# Patient Record
Sex: Male | Born: 1970 | Race: Black or African American | Hispanic: No | Marital: Married | Smoking: Never smoker
Health system: Southern US, Community
[De-identification: ages and names within clinical notes are randomized; demographics above are authoritative.]

## PROBLEM LIST (undated history)

## (undated) DIAGNOSIS — R51 Headache: Secondary | ICD-10-CM

## (undated) DIAGNOSIS — Z9889 Other specified postprocedural states: Secondary | ICD-10-CM

## (undated) DIAGNOSIS — F419 Anxiety disorder, unspecified: Secondary | ICD-10-CM

## (undated) DIAGNOSIS — R519 Headache, unspecified: Secondary | ICD-10-CM

## (undated) DIAGNOSIS — R112 Nausea with vomiting, unspecified: Secondary | ICD-10-CM

## (undated) DIAGNOSIS — T783XXA Angioneurotic edema, initial encounter: Secondary | ICD-10-CM

## (undated) DIAGNOSIS — I82409 Acute embolism and thrombosis of unspecified deep veins of unspecified lower extremity: Secondary | ICD-10-CM

## (undated) DIAGNOSIS — R7303 Prediabetes: Secondary | ICD-10-CM

## (undated) DIAGNOSIS — I10 Essential (primary) hypertension: Secondary | ICD-10-CM

## (undated) DIAGNOSIS — M8700 Idiopathic aseptic necrosis of unspecified bone: Secondary | ICD-10-CM

## (undated) HISTORY — DX: Headache: R51

## (undated) HISTORY — PX: OTHER SURGICAL HISTORY: SHX169

## (undated) HISTORY — DX: Angioneurotic edema, initial encounter: T78.3XXA

## (undated) HISTORY — DX: Headache, unspecified: R51.9

---

## 2005-04-11 ENCOUNTER — Emergency Department (HOSPITAL_COMMUNITY): Admission: EM | Admit: 2005-04-11 | Discharge: 2005-04-12 | Payer: Self-pay | Admitting: Emergency Medicine

## 2010-06-18 ENCOUNTER — Emergency Department (HOSPITAL_COMMUNITY)
Admission: EM | Admit: 2010-06-18 | Discharge: 2010-06-18 | Disposition: A | Discharge status: Home / Self Care | Payer: Self-pay | Attending: Emergency Medicine | Admitting: Emergency Medicine

## 2010-06-18 ENCOUNTER — Emergency Department (HOSPITAL_COMMUNITY): Payer: Self-pay

## 2010-06-18 DIAGNOSIS — S6990XA Unspecified injury of unspecified wrist, hand and finger(s), initial encounter: Secondary | ICD-10-CM | POA: Insufficient documentation

## 2010-06-18 DIAGNOSIS — W2209XA Striking against other stationary object, initial encounter: Secondary | ICD-10-CM | POA: Insufficient documentation

## 2010-06-18 DIAGNOSIS — Y9349 Activity, other involving dancing and other rhythmic movements: Secondary | ICD-10-CM | POA: Insufficient documentation

## 2010-06-18 DIAGNOSIS — M79609 Pain in unspecified limb: Secondary | ICD-10-CM | POA: Insufficient documentation

## 2010-06-18 DIAGNOSIS — M20029 Boutonniere deformity of unspecified finger(s): Secondary | ICD-10-CM | POA: Insufficient documentation

## 2013-04-11 ENCOUNTER — Encounter (HOSPITAL_COMMUNITY): Payer: Self-pay | Admitting: Emergency Medicine

## 2013-04-11 DIAGNOSIS — M546 Pain in thoracic spine: Secondary | ICD-10-CM | POA: Insufficient documentation

## 2013-04-11 DIAGNOSIS — I10 Essential (primary) hypertension: Secondary | ICD-10-CM | POA: Insufficient documentation

## 2013-04-11 DIAGNOSIS — Z8739 Personal history of other diseases of the musculoskeletal system and connective tissue: Secondary | ICD-10-CM | POA: Insufficient documentation

## 2013-04-11 NOTE — ED Notes (Signed)
Pt states pain in his upper right back. Pt denies known reason for the pain but over the past few days pt states pain has gotten worse. Pt denies problems with urine or bowel.

## 2013-04-12 ENCOUNTER — Emergency Department (HOSPITAL_COMMUNITY)
Admission: EM | Admit: 2013-04-12 | Discharge: 2013-04-12 | Disposition: A | Discharge status: Home / Self Care | Payer: PRIVATE HEALTH INSURANCE | Attending: Emergency Medicine | Admitting: Emergency Medicine

## 2013-04-12 ENCOUNTER — Emergency Department (HOSPITAL_COMMUNITY): Payer: PRIVATE HEALTH INSURANCE

## 2013-04-12 DIAGNOSIS — M549 Dorsalgia, unspecified: Secondary | ICD-10-CM

## 2013-04-12 HISTORY — DX: Essential (primary) hypertension: I10

## 2013-04-12 MED ORDER — KETOROLAC TROMETHAMINE 30 MG/ML IJ SOLN
INTRAMUSCULAR | Status: AC
  Administered 2013-04-12: 60 mg via INTRAMUSCULAR
  Filled 2013-04-12: qty 2

## 2013-04-12 MED ORDER — METHOCARBAMOL 500 MG PO TABS: 500.0000 mg | ORAL_TABLET | Freq: Two times a day (BID) | ORAL | Status: DC

## 2013-04-12 MED ORDER — KETOROLAC TROMETHAMINE 30 MG/ML IJ SOLN
60.0000 mg | Freq: Once | INTRAMUSCULAR | Status: AC
  Administered 2013-04-12: 60 mg via INTRAMUSCULAR

## 2013-04-12 MED ORDER — MELOXICAM 7.5 MG PO TABS: 15.0000 mg | ORAL_TABLET | Freq: Every day | ORAL | Status: DC

## 2013-04-12 MED ORDER — KETOROLAC TROMETHAMINE 60 MG/2ML IM SOLN: 60.0000 mg | Freq: Once | INTRAMUSCULAR | Status: DC

## 2013-04-12 NOTE — Discharge Instructions (Signed)
Your Xray today is negative for any acute lung process or rib injury. Recommend Mobic and Robaxin as prescribed as well as ice to the affected area 4 times a day for the next 3 days for at least 30 minutes each time. You may alternate ice and heat packs after 72 hours. Follow up with your orthopedist as needed. Return if symptoms worsen.  Back Pain, Adult Low back pain is very common. About 1 in 5 people have back pain.The cause of low back pain is rarely dangerous. The pain often gets better over time.About half of people with a sudden onset of back pain feel better in just 2 weeks. About 8 in 10 people feel better by 6 weeks.  CAUSES Some common causes of back pain include:  Strain of the muscles or ligaments supporting the spine.  Wear and tear (degeneration) of the spinal discs.  Arthritis.  Direct injury to the back. DIAGNOSIS Most of the time, the direct cause of low back pain is not known.However, back pain can be treated effectively even when the exact cause of the pain is unknown.Answering your caregiver's questions about your overall health and symptoms is one of the most accurate ways to make sure the cause of your pain is not dangerous. If your caregiver needs more information, he or she may order lab work or imaging tests (X-rays or MRIs).However, even if imaging tests show changes in your back, this usually does not require surgery. HOME CARE INSTRUCTIONS For many people, back pain returns.Since low back pain is rarely dangerous, it is often a condition that people can learn to [facility] their own.   Remain active. It is stressful on the back to sit or stand in one place. Do not sit, drive, or stand in one place for more than 30 minutes at a time. Take short walks on level surfaces as soon as pain allows.Try to increase the length of time you walk each day.  Do not stay in bed.Resting more than 1 or 2 days can delay your recovery.  Do not avoid exercise or work.Your body  is made to move.It is not dangerous to be active, even though your back may hurt.Your back will likely heal faster if you return to being active before your pain is gone.  Pay attention to your body when you bend and lift. Many people have less discomfortwhen lifting if they bend their knees, keep the load close to their bodies,and avoid twisting. Often, the most comfortable positions are those that put less stress on your recovering back.  Find a comfortable position to sleep. Use a firm mattress and lie on your side with your knees slightly bent. If you lie on your back, put a pillow under your knees.  Only take over-the-counter or prescription medicines as directed by your caregiver. Over-the-counter medicines to reduce pain and inflammation are often the most helpful.Your caregiver may prescribe muscle relaxant drugs.These medicines help dull your pain so you can more quickly return to your normal activities and healthy exercise.  Put ice on the injured area.  Put ice in a plastic bag.  Place a towel between your skin and the bag.  Leave the ice on for 15-20 minutes, 03-04 times a day for the first 2 to 3 days. After that, ice and heat may be alternated to reduce pain and spasms.  Ask your caregiver about trying back exercises and gentle massage. This may be of some benefit.  Avoid feeling anxious or stressed.Stress increases muscle tension  and can worsen back pain.It is important to recognize when you are anxious or stressed and learn ways to manage it.Exercise is a great option. SEEK MEDICAL CARE IF:  You have pain that is not relieved with rest or medicine.  You have pain that does not improve in 1 week.  You have new symptoms.  You are generally not feeling well. SEEK IMMEDIATE MEDICAL CARE IF:   You have pain that radiates from your back into your legs.  You develop new bowel or bladder control problems.  You have unusual weakness or numbness in your arms or  legs.  You develop nausea or vomiting.  You develop abdominal pain.  You feel faint. Document Released: 03/23/2005 Document Revised: 09/22/2011 Document Reviewed: 08/11/2010 [facility] Patient Information 2014 [place], [place].

## 2013-04-12 NOTE — ED Notes (Signed)
Patient presents stating he has started with pain to his mid back.  Denies picking up anything heavy.

## 2013-04-12 NOTE — ED Provider Notes (Signed)
Medical screening examination/treatment/procedure(s) were performed by non-physician practitioner and as supervising physician I was immediately available for consultation/collaboration.  EKG Interpretation   None         Glynn OctaveStephen Jacque Byron, MD 04/12/13 [phone]

## 2013-04-12 NOTE — ED Notes (Signed)
Discharge inst given

## 2013-04-12 NOTE — ED Provider Notes (Signed)
CSN: [id number]     Arrival date & time 04/11/13  2305 History   First MD Initiated Contact with Patient 04/12/13 0043     Chief Complaint  Patient presents with  . Back Pain   (Consider location/radiation/quality/duration/timing/severity/associated sxs/prior Treatment) HPI Comments: Patient is a 43 y/o male with a hx of arthritis who presents today for 1 week of L thoracic back pain that has been worsening x 3 days. Pain is constant, throbbing, and waxing and waning in nature with intermittent sharp pain sensations. Pain is nonradiating and without modifying factors. He has not taken anything OTC for his symptoms. He denies trauma/injury to the area or heavy lifting. Patient further denies associated fever, SOB, DOE, cough, CP, numbness/tingling, extremity weakness, bowel/bladder incontinence, saddle anesthesia, perianal numbness, prolonged travel, HRT, and recent surgeries/hospitalizations.  The history is provided by the patient. No language interpreter was used.    Past Medical History  Diagnosis Date  . Hypertension   . Arthritis    History reviewed. No pertinent past surgical history. History reviewed. No pertinent family history. History  Substance Use Topics  . Smoking status: Never Smoker   . Smokeless tobacco: Not on file  . Alcohol Use: No    Review of Systems  Musculoskeletal: Positive for back pain.  All other systems reviewed and are negative.    Allergies  Review of patient's allergies indicates no known allergies.  Home Medications   Current Outpatient Rx  Name  Route  Sig  Dispense  Refill  . meloxicam (MOBIC) 7.5 MG tablet   Oral   Take 2 tablets (15 mg total) by mouth daily.   30 tablet   0   . methocarbamol (ROBAXIN) 500 MG tablet   Oral   Take 1 tablet (500 mg total) by mouth 2 (two) times daily.   20 tablet   0    BP 128/87  Pulse 63  Temp(Src) 98.9 F (37.2 C) (Oral)  Resp 18  Ht 5\' 8"  (1.727 m)  Wt 165 lb 5 oz (74.985 kg)  BMI 25.14  kg/m2  SpO2 99%  Physical Exam  Nursing note and vitals reviewed. Constitutional: He is oriented to person, place, and time. He appears well-developed and well-nourished. No distress.  HENT:  Head: Normocephalic and atraumatic.  Eyes: Conjunctivae and EOM are normal. No scleral icterus.  Neck: Normal range of motion.  Cardiovascular: Normal rate, regular rhythm and normal heart sounds.   Pulmonary/Chest: Effort normal. No respiratory distress. He has no wheezes. He has no rales.  Musculoskeletal: Normal range of motion. He exhibits tenderness.       Cervical back: Normal.       Thoracic back: He exhibits tenderness. He exhibits normal range of motion, no bony tenderness, no swelling, no edema, no deformity and no spasm.       Lumbar back: Normal.       Back:  No TTP to the cervical, thoracic, or lumbosacral midline. No bony deformities or step offs palpated.  Neurological: He is alert and oriented to person, place, and time.  No gross sensory deficits appreciated. Patient moves extremities without ataxia. Grip strength normal and equal b/l.  Skin: Skin is warm and dry. No rash noted. He is not diaphoretic. No erythema. No pallor.  Psychiatric: He has a normal mood and affect. His behavior is normal.    ED Course  Procedures (including critical care time) Labs Review Labs Reviewed - No data to display Imaging Review Dg Ribs Unilateral W/chest Left  04/12/2013   CLINICAL DATA:  Pain  EXAM: LEFT RIBS AND CHEST - 3+ VIEW  COMPARISON:  None.  FINDINGS: Frontal chest as well as bilateral oblique and cone-down lower rib images were obtained. Lungs are clear. Heart size and pulmonary vascularity are normal. No adenopathy.  No effusion or pneumothorax.  No demonstrable rib fracture apparent.  IMPRESSION: Lungs clear.  No demonstrable rib fracture.   Electronically Signed   By: Bretta Bang M.D.   On: 04/12/2013 01:38    EKG Interpretation   None       MDM   1. Back pain     43 year old male presents for left-sided thoracic back pain with onset one week ago, worsening over the last 3 days. Patient well and nontoxic appearing, hemodynamically stable, and afebrile. Patient denies any trauma or injury to the area inciting his pain. Heart regular rate and rhythm lungs clear to auscultation bilaterally. Patient with some point tenderness to the left paraspinal muscles just inferior to his left scapula. X-ray today is unremarkable; no cardiopulmonary abnormalities or bony deformities appreciated. Doubt atypically presenting pulmonary embolism as patient without tachycardia, tachypnea, dyspnea, or hypoxia. He is PERC negative. He endorses significant improvement with IM Toradol given in ED. He is stable for discharge with primary care followup and prescriptions for Mobic and Robaxin. Return precautions provided and patient agreeable to plan with no unaddressed concerns.   Filed Vitals:   04/11/13 2310 04/12/13 0200  BP: 141/86 128/87  Pulse: 70 63  Temp: 98.9 F (37.2 C)   TempSrc: Oral   Resp: 16 18  Height: 5\' 8"  (1.727 m)   Weight: 165 lb 5 oz (74.985 kg)   SpO2: 98% 99%     [name], PA-C 04/12/13 [phone]

## 2014-04-30 ENCOUNTER — Emergency Department (INDEPENDENT_AMBULATORY_CARE_PROVIDER_SITE_OTHER)
Admission: EM | Admit: 2014-04-30 | Discharge: 2014-04-30 | Disposition: A | Discharge status: Home / Self Care | Payer: PRIVATE HEALTH INSURANCE | Source: Home / Self Care | Attending: Family Medicine | Admitting: Family Medicine

## 2014-04-30 ENCOUNTER — Encounter (HOSPITAL_COMMUNITY): Payer: Self-pay | Admitting: Emergency Medicine

## 2014-04-30 DIAGNOSIS — Z23 Encounter for immunization: Secondary | ICD-10-CM

## 2014-04-30 DIAGNOSIS — T148 Other injury of unspecified body region: Secondary | ICD-10-CM

## 2014-04-30 DIAGNOSIS — S61419A Laceration without foreign body of unspecified hand, initial encounter: Secondary | ICD-10-CM

## 2014-04-30 DIAGNOSIS — W540XXA Bitten by dog, initial encounter: Secondary | ICD-10-CM

## 2014-04-30 MED ORDER — AMOXICILLIN-POT CLAVULANATE 875-125 MG PO TABS: 1.0000 | ORAL_TABLET | Freq: Two times a day (BID) | ORAL | Status: DC

## 2014-04-30 MED ORDER — TETANUS-DIPHTH-ACELL PERTUSSIS 5-2.5-18.5 LF-MCG/0.5 IM SUSP
0.5000 mL | Freq: Once | INTRAMUSCULAR | Status: AC
  Administered 2014-04-30: 0.5 mL via INTRAMUSCULAR

## 2014-04-30 MED ORDER — TETANUS-DIPHTH-ACELL PERTUSSIS 5-2.5-18.5 LF-MCG/0.5 IM SUSP
INTRAMUSCULAR | Status: AC
  Filled 2014-04-30: qty 0.5

## 2014-04-30 NOTE — ED Provider Notes (Signed)
Eric Johnston is a 44 y.o. male who presents to Urgent Care today for dog bites to his hands. Patient tried to break up a dog fight at home last night when he was bitten in both hands. He has lacerations to the palmar and dorsal aspects of both hands. His last tetanus vaccination was greater than 5 years ago. He denies any significant pain fevers or chills.   Past Medical History  Diagnosis Date  . Hypertension   . Arthritis    History reviewed. No pertinent past surgical history. History  Substance Use Topics  . Smoking status: Never Smoker   . Smokeless tobacco: Not on file  . Alcohol Use: No   ROS as above Medications: No current facility-administered medications for this encounter.   Current Outpatient Prescriptions  Medication Sig Dispense Refill  . meloxicam (MOBIC) 7.5 MG tablet Take 2 tablets (15 mg total) by mouth daily. 30 tablet 0  . methocarbamol (ROBAXIN) 500 MG tablet Take 1 tablet (500 mg total) by mouth 2 (two) times daily. 20 tablet 0   No Known Allergies   Exam:  BP 149/96 mmHg  Pulse 77  Temp(Src) 98.9 F (37.2 C) (Oral)  Resp 16  SpO2 95% Gen: Well NAD Hands bilaterally: 1 laceration to the right third digit at the distal end of the middle phalanx on the radial aspect. The laceration extends into the dermis but does not involve tendons. No bony structures visible. The right hand motion is intact as is strength sensation and capillary refill are intact distally.  The left hand has a 2 cm laceration to the hyperthenar eminence at the ulnar fifth metacarpal. The laceration extends the dermis but does not involve deeper structures or tendons or bones.  There is also a 1 cm laceration at the palmar aspect at the fourth proximal phalanx. Again the laceration extends into the dermis but does not involve deeper structures. Hand motion strength capillary refill are intact as is sensation.         The wounds were soaked and cleaned warm water for 20 minutes.  Then sterile saline was used to irrigate the wounds copiously.   No results found for this or any previous visit (from the past 24 hour(s)). No results found.  Assessment and Plan: 44 y.o. male with dog bites to hands. Patient has 3 lacerations. The do not appear to be infected this time nor the involve deeper structures.   Additionally his tetanus is out of date. We'll provide new tetanus vaccination and treat empirically prophylactically with Augmentin. Will not suture wounds as they are not very deep nor to the involved deep structures. Risk of infection is very high. Will return to clinic as needed. Watchful waiting and allow wounds to heal via secondary intention.  Discussed warning signs or symptoms. Please see discharge instructions. Patient expresses understanding.     Rodolph BongEvan S Corey, MD 04/30/14 [phone]

## 2014-04-30 NOTE — ED Notes (Signed)
Pt was breaking up a fight between his two dogs when he suffered several bites on both of his hands.  Pt states his dogs are up to date on all of their shots.

## 2014-04-30 NOTE — Discharge Instructions (Signed)
Thank you for coming in today. Return as needed.  Go to the ER if they worsen.    Delayed Wound Closure Sometimes, your health care provider will decide to delay closing a wound for several days. This is done when the wound is badly bruised, dirty, or when it has been several hours since the injury happened. By delaying the closure of your wound, the risk of infection is reduced. Wounds that are closed in 3-7 days after being cleaned up and dressed heal just as well as those that are closed right away. HOME CARE INSTRUCTIONS  Rest and elevate the injured area until the pain and swelling are gone.  Have your wound checked as instructed by your health care provider. SEEK MEDICAL CARE IF:  You develop unusual or increased swelling or redness around the wound.  You have increasing pain or tenderness.  There is increasing fluid (drainage) or a bad smelling drainage coming from the wound. Document Released: 03/23/2005 Document Revised: 03/28/2013 Document Reviewed: 09/20/2012 [facility] Patient Information 2015 [place], [place]. This information is not intended to replace advice given to you by your health care provider. Make sure you discuss any questions you have with your health care provider.   Animal Bite An animal bite can result in a scratch on the skin, deep open cut, puncture of the skin, crush injury, or tearing away of the skin or a body part. Dogs are responsible for most animal bites. Children are bitten more often than adults. An animal bite can range from very mild to more serious. A small bite from your house pet is no cause for alarm. However, some animal bites can become infected or injure a bone or other tissue. You must seek medical care if:  The skin is broken and bleeding does not slow down or stop after 15 minutes.  The puncture is deep and difficult to clean (such as a cat bite).  Pain, warmth, redness, or pus develops around the wound.  The bite is from a stray animal  or rodent. There may be a risk of rabies infection.  The bite is from a snake, raccoon, skunk, fox, coyote, or bat. There may be a risk of rabies infection.  The person bitten has a chronic illness such as diabetes, liver disease, or cancer, or the person takes medicine that lowers the immune system.  There is concern about the location and severity of the bite. It is important to clean and protect an animal bite wound right away to prevent infection. Follow these steps:  Clean the wound with plenty of water and soap.  Apply an antibiotic cream.  Apply gentle pressure over the wound with a clean towel or gauze to slow or stop bleeding.  Elevate the affected area above the heart to help stop any bleeding.  Seek medical care. Getting medical care within 8 hours of the animal bite leads to the best possible outcome. DIAGNOSIS  Your caregiver will most likely:  Take a detailed history of the animal and the bite injury.  Perform a wound exam.  Take your medical history. Blood tests or X-rays may be performed. Sometimes, infected bite wounds are cultured and sent to a lab to identify the infectious bacteria.  TREATMENT  Medical treatment will depend on the location and type of animal bite as well as the patient's medical history. Treatment may include:  Wound care, such as cleaning and flushing the wound with saline solution, bandaging, and elevating the affected area.  Antibiotics.  Tetanus  immunization.  Rabies immunization.  Leaving the wound open to heal. This is often done with animal bites, due to the high risk of infection. However, in certain cases, wound closure with stitches, wound adhesive, skin adhesive strips, or staples may be used. Infected bites that are left untreated may require intravenous (IV) antibiotics and surgical treatment in the hospital. HOME CARE INSTRUCTIONS  Follow your caregiver's instructions for wound care.  Take all medicines as directed.  If  your caregiver prescribes antibiotics, take them as directed. Finish them even if you start to feel better.  Follow up with your caregiver for further exams or immunizations as directed. You may need a tetanus shot if:  You cannot remember when you had your last tetanus shot.  You have never had a tetanus shot.  The injury broke your skin. If you get a tetanus shot, your arm may swell, get red, and feel warm to the touch. This is common and not a problem. If you need a tetanus shot and you choose not to have one, there is a rare chance of getting tetanus. Sickness from tetanus can be serious. SEEK MEDICAL CARE IF:  You notice warmth, redness, soreness, swelling, pus discharge, or a bad smell coming from the wound.  You have a red line on the skin coming from the wound.  You have a fever, chills, or a general ill feeling.  You have nausea or vomiting.  You have continued or worsening pain.  You have trouble moving the injured part.  You have other questions or concerns. MAKE SURE YOU:  Understand these instructions.  Will watch your condition.  Will get help right away if you are not doing well or get worse. Document Released: 12/09/2010 Document Revised: 06/15/2011 Document Reviewed: 12/09/2010 [facility] Patient Information 2015 [place], [place]. This information is not intended to replace advice given to you by your health care provider. Make sure you discuss any questions you have with your health care provider.

## 2015-06-04 ENCOUNTER — Other Ambulatory Visit: Payer: Self-pay | Admitting: Physician Assistant

## 2015-06-05 NOTE — Patient Instructions (Addendum)
YOUR PROCEDURE IS SCHEDULED ON : 06/14/15  REPORT TO [facility] MAIN ENTRANCE FOLLOW SIGNS TO EAST ELEVATOR - GO TO 3rd FLOOR CHECK IN AT 3 EAST NURSES STATION (SHORT STAY) AT:  12:45pm  CALL THIS NUMBER IF YOU HAVE PROBLEMS THE MORNING OF SURGERY [phone]  REMEMBER:ONLY 1 PER PERSON MAY GO TO SHORT STAY WITH YOU TO GET READY THE MORNING OF YOUR SURGERY  DO NOT EAT FOOD  AFTER MIDNIGHT  MAY HAVE CLEAR LIQUIDS UNTIL 8:45 AM  TAKE THESE MEDICINES THE MORNING OF SURGERY: NONE  CLEAR LIQUID DIET  Foods Allowed                                                                     Foods Excluded  Coffee and tea, regular and decaf                             liquids that you cannot  Plain Jell-O in any flavor                                             see through such as: Fruit ices (not with fruit pulp)                                     milk, soups, orange juice  Iced Popsicles                                                        All solid food Carbonated beverages, regular and diet                                    Cranberry, grape and apple juices Sports drinks like Gatorade Lightly seasoned clear broth or consume(fat free) Sugar, honey syrup  _____________________________________________________________________    YOU MAY NOT HAVE ANY METAL ON YOUR BODY INCLUDING HAIR PINS AND PIERCING'S. DO NOT WEAR JEWELRY, MAKEUP, LOTIONS, POWDERS OR PERFUMES. DO NOT WEAR NAIL POLISH. DO NOT SHAVE 48 HRS PRIOR TO SURGERY. MEN MAY SHAVE FACE AND NECK.  DO NOT BRING VALUABLES TO HOSPITAL. [facility] IS NOT RESPONSIBLE FOR VALUABLES.  CONTACTS, DENTURES OR PARTIALS MAY NOT BE WORN TO SURGERY. LEAVE SUITCASE IN CAR. CAN BE BROUGHT TO ROOM AFTER SURGERY.  PATIENTS DISCHARGED THE DAY OF SURGERY WILL NOT BE ALLOWED TO DRIVE HOME.  PLEASE READ OVER THE FOLLOWING INSTRUCTION SHEETS _________________________________________________________________________________                                       [place] - PREPARING FOR SURGERY  Before surgery, you can play an important role.  Because skin is not sterile, your  skin needs to be as free of germs as possible.  You can reduce the number of germs on your skin by washing with CHG (chlorahexidine gluconate) soap before surgery.  CHG is an antiseptic cleaner which kills germs and bonds with the skin to continue killing germs even after washing. Please DO NOT use if you have an allergy to CHG or antibacterial soaps.  If your skin becomes reddened/irritated stop using the CHG and inform your nurse when you arrive at Short Stay. Do not shave (including legs and underarms) for at least 48 hours prior to the first CHG shower.  You may shave your face. Please follow these instructions carefully:   1.  Shower with CHG Soap the night before surgery and the  morning of Surgery.   2.  If you choose to wash your hair, wash your hair first as usual with your  normal  Shampoo.   3.  After you shampoo, rinse your hair and body thoroughly to remove the  shampoo.                                         4.  Use CHG as you would any other liquid soap.  You can apply chg directly  to the skin and wash . Gently wash with scrungie or clean wascloth    5.  Apply the CHG Soap to your body ONLY FROM THE NECK DOWN.   Do not use on open                           Wound or open sores. Avoid contact with eyes, ears mouth and genitals (private parts).                        Genitals (private parts) with your normal soap.              6.  Wash thoroughly, paying special attention to the area where your surgery  will be performed.   7.  Thoroughly rinse your body with warm water from the neck down.   8.  DO NOT shower/wash with your normal soap after using and rinsing off  the CHG Soap .                9.  Pat yourself dry with a clean towel.             10.  Wear clean night clothes to bed after shower             11.  Place  clean sheets on your bed the night of your first shower and do not  sleep with pets.  Day of Surgery : Do not apply any lotions/deodorants the morning of surgery.  Please wear clean clothes to the hospital/surgery center.  FAILURE TO FOLLOW THESE INSTRUCTIONS MAY RESULT IN THE CANCELLATION OF YOUR SURGERY    PATIENT SIGNATURE_________________________________  ______________________________________________________________________     Eric Johnston  An incentive spirometer is a tool that can help keep your lungs clear and active. This tool measures how well you are filling your lungs with each breath. Taking long deep breaths may help reverse or decrease the chance of developing breathing (pulmonary) problems (especially infection) following:  A long period of time when you are unable to move or be active. BEFORE THE PROCEDURE  If the spirometer includes an indicator to show your best effort, your nurse or respiratory therapist will set it to a desired goal.  If possible, sit up straight or lean slightly forward. Try not to slouch.  Hold the incentive spirometer in an upright position. INSTRUCTIONS FOR USE  1. Sit on the edge of your bed if possible, or sit up as far as you can in bed or on a chair. 2. Hold the incentive spirometer in an upright position. 3. Breathe out normally. 4. Place the mouthpiece in your mouth and seal your lips tightly around it. 5. Breathe in slowly and as deeply as possible, raising the piston or the ball toward the top of the column. 6. Hold your breath for 3-5 seconds or for as long as possible. Allow the piston or ball to fall to the bottom of the column. 7. Remove the mouthpiece from your mouth and breathe out normally. 8. Rest for a few seconds and repeat Steps 1 through 7 at least 10 times every 1-2 hours when you are awake. Take your time and take a few normal breaths between deep breaths. 9. The spirometer may include an indicator to show  your best effort. Use the indicator as a goal to work toward during each repetition. 10. After each set of 10 deep breaths, practice coughing to be sure your lungs are clear. If you have an incision (the cut made at the time of surgery), support your incision when coughing by placing a pillow or rolled up towels firmly against it. Once you are able to get out of bed, walk around indoors and cough well. You may stop using the incentive spirometer when instructed by your caregiver.  RISKS AND COMPLICATIONS  Take your time so you do not get dizzy or light-headed.  If you are in pain, you may need to take or ask for pain medication before doing incentive spirometry. It is harder to take a deep breath if you are having pain. AFTER USE  Rest and breathe slowly and easily.  It can be helpful to keep track of a log of your progress. Your caregiver can provide you with a simple table to help with this. If you are using the spirometer at home, follow these instructions: SEEK MEDICAL CARE IF:   You are having difficultly using the spirometer.  You have trouble using the spirometer as often as instructed.  Your pain medication is not giving enough relief while using the spirometer.  You develop fever of 100.5 F (38.1 C) or higher. SEEK IMMEDIATE MEDICAL CARE IF:   You cough up bloody sputum that had not been present before.  You develop fever of 102 F (38.9 C) or greater.  You develop worsening pain at or near the incision site. MAKE SURE YOU:   Understand these instructions.  Will watch your condition.  Will get help right away if you are not doing well or get worse. Document Released: 08/03/2006 Document Revised: 06/15/2011 Document Reviewed: 10/04/2006 [facility] Patient Information 2014 [place], [place].   ________________________________________________________________________

## 2015-06-06 ENCOUNTER — Encounter (HOSPITAL_COMMUNITY)
Admission: RE | Admit: 2015-06-06 | Discharge: 2015-06-06 | Disposition: A | Discharge status: Home / Self Care | Payer: PRIVATE HEALTH INSURANCE | Source: Ambulatory Visit | Attending: Orthopaedic Surgery | Admitting: Orthopaedic Surgery

## 2015-06-06 ENCOUNTER — Encounter (HOSPITAL_COMMUNITY): Payer: Self-pay

## 2015-06-06 DIAGNOSIS — Z01818 Encounter for other preprocedural examination: Secondary | ICD-10-CM | POA: Insufficient documentation

## 2015-06-06 DIAGNOSIS — M879 Osteonecrosis, unspecified: Secondary | ICD-10-CM | POA: Insufficient documentation

## 2015-06-06 DIAGNOSIS — Z0183 Encounter for blood typing: Secondary | ICD-10-CM | POA: Diagnosis not present

## 2015-06-06 DIAGNOSIS — R9431 Abnormal electrocardiogram [ECG] [EKG]: Secondary | ICD-10-CM | POA: Diagnosis not present

## 2015-06-06 DIAGNOSIS — Z01812 Encounter for preprocedural laboratory examination: Secondary | ICD-10-CM | POA: Insufficient documentation

## 2015-06-06 DIAGNOSIS — I10 Essential (primary) hypertension: Secondary | ICD-10-CM | POA: Insufficient documentation

## 2015-06-06 HISTORY — DX: Idiopathic aseptic necrosis of unspecified bone: M87.00

## 2015-06-06 LAB — SURGICAL PCR SCREEN
MRSA, PCR: NEGATIVE
STAPHYLOCOCCUS AUREUS: NEGATIVE

## 2015-06-06 LAB — CBC
HEMATOCRIT: 45.3 % (ref 39.0–52.0)
HEMOGLOBIN: 13.9 g/dL (ref 13.0–17.0)
MCH: 25 pg — AB (ref 26.0–34.0)
MCHC: 30.7 g/dL (ref 30.0–36.0)
MCV: 81.6 fL (ref 78.0–100.0)
Platelets: 169 10*3/uL (ref 150–400)
RBC: 5.55 MIL/uL (ref 4.22–5.81)
RDW: 14.1 % (ref 11.5–15.5)
WBC: 4.6 10*3/uL (ref 4.0–10.5)

## 2015-06-06 LAB — BASIC METABOLIC PANEL
ANION GAP: 7 (ref 5–15)
BUN: 20 mg/dL (ref 6–20)
CHLORIDE: 107 mmol/L (ref 101–111)
CO2: 25 mmol/L (ref 22–32)
Calcium: 9.3 mg/dL (ref 8.9–10.3)
Creatinine, Ser: 0.96 mg/dL (ref 0.61–1.24)
GFR calc Af Amer: >60 mL/min (ref >60)
Glucose, Bld: 102 mg/dL — ABNORMAL HIGH (ref 65–99)
POTASSIUM: 4.7 mmol/L (ref 3.5–5.1)
Sodium: 139 mmol/L (ref 135–145)

## 2015-06-06 LAB — PROTIME-INR
INR: 1.09 (ref 0.00–1.49)
PROTHROMBIN TIME: 14.3 s (ref 11.6–15.2)

## 2015-06-06 LAB — APTT: APTT: 34 s (ref 24–37)

## 2015-06-06 LAB — ABO/RH: ABO/RH(D): O POS

## 2015-06-06 NOTE — Progress Notes (Signed)
Old and new EKG reviewed by Dr.Crews - no action needed

## 2015-06-14 ENCOUNTER — Inpatient Hospital Stay (HOSPITAL_COMMUNITY): Payer: PRIVATE HEALTH INSURANCE | Admitting: Anesthesiology

## 2015-06-14 ENCOUNTER — Inpatient Hospital Stay (HOSPITAL_COMMUNITY): Payer: PRIVATE HEALTH INSURANCE

## 2015-06-14 ENCOUNTER — Inpatient Hospital Stay (HOSPITAL_COMMUNITY)
Admission: RE | Admit: 2015-06-14 | Discharge: 2015-06-16 | DRG: 470 | Disposition: A | Discharge status: Home Health | Payer: PRIVATE HEALTH INSURANCE | Source: Ambulatory Visit | Attending: Orthopaedic Surgery | Admitting: Orthopaedic Surgery

## 2015-06-14 ENCOUNTER — Encounter (HOSPITAL_COMMUNITY)
Admission: RE | Disposition: A | Discharge status: Home Health | Payer: Self-pay | Source: Ambulatory Visit | Attending: Orthopaedic Surgery

## 2015-06-14 ENCOUNTER — Encounter (HOSPITAL_COMMUNITY): Payer: Self-pay | Admitting: *Deleted

## 2015-06-14 DIAGNOSIS — Z01812 Encounter for preprocedural laboratory examination: Secondary | ICD-10-CM | POA: Diagnosis not present

## 2015-06-14 DIAGNOSIS — Z96641 Presence of right artificial hip joint: Secondary | ICD-10-CM

## 2015-06-14 DIAGNOSIS — M25551 Pain in right hip: Secondary | ICD-10-CM | POA: Diagnosis present

## 2015-06-14 DIAGNOSIS — Z419 Encounter for procedure for purposes other than remedying health state, unspecified: Secondary | ICD-10-CM

## 2015-06-14 DIAGNOSIS — M87051 Idiopathic aseptic necrosis of right femur: Secondary | ICD-10-CM | POA: Diagnosis present

## 2015-06-14 HISTORY — PX: TOTAL HIP ARTHROPLASTY: SHX124

## 2015-06-14 LAB — TYPE AND SCREEN
ABO/RH(D): O POS
ANTIBODY SCREEN: NEGATIVE

## 2015-06-14 SURGERY — ARTHROPLASTY, HIP, TOTAL, ANTERIOR APPROACH
Anesthesia: General | Site: Hip | Laterality: Right

## 2015-06-14 MED ORDER — KETOROLAC TROMETHAMINE 15 MG/ML IJ SOLN
7.5000 mg | Freq: Four times a day (QID) | INTRAMUSCULAR | Status: AC
  Administered 2015-06-14 – 2015-06-15 (×4): 7.5 mg via INTRAVENOUS
  Filled 2015-06-14 (×4): qty 1

## 2015-06-14 MED ORDER — ONDANSETRON HCL 4 MG/2ML IJ SOLN
INTRAMUSCULAR | Status: DC | PRN
  Administered 2015-06-14: 4 mg via INTRAVENOUS

## 2015-06-14 MED ORDER — ONDANSETRON HCL 4 MG/2ML IJ SOLN
4.0000 mg | Freq: Four times a day (QID) | INTRAMUSCULAR | Status: DC | PRN
  Administered 2015-06-14: 4 mg via INTRAVENOUS
  Filled 2015-06-14: qty 2

## 2015-06-14 MED ORDER — OXYCODONE HCL 5 MG PO TABS
5.0000 mg | ORAL_TABLET | ORAL | Status: DC | PRN
  Administered 2015-06-14: 10 mg via ORAL
  Administered 2015-06-15: 5 mg via ORAL
  Administered 2015-06-15: 10 mg via ORAL
  Administered 2015-06-15 – 2015-06-16 (×3): 5 mg via ORAL
  Filled 2015-06-14 (×4): qty 1
  Filled 2015-06-14 (×2): qty 2

## 2015-06-14 MED ORDER — FENTANYL CITRATE (PF) 250 MCG/5ML IJ SOLN
INTRAMUSCULAR | Status: AC
  Filled 2015-06-14: qty 5

## 2015-06-14 MED ORDER — ACETAMINOPHEN 325 MG PO TABS: 650.0000 mg | ORAL_TABLET | Freq: Four times a day (QID) | ORAL | Status: DC | PRN

## 2015-06-14 MED ORDER — ASPIRIN EC 325 MG PO TBEC
325.0000 mg | DELAYED_RELEASE_TABLET | Freq: Two times a day (BID) | ORAL | Status: DC
  Administered 2015-06-15 – 2015-06-16 (×3): 325 mg via ORAL
  Filled 2015-06-14 (×5): qty 1

## 2015-06-14 MED ORDER — METHOCARBAMOL 500 MG PO TABS
500.0000 mg | ORAL_TABLET | Freq: Four times a day (QID) | ORAL | Status: DC | PRN
  Administered 2015-06-15 – 2015-06-16 (×3): 500 mg via ORAL
  Filled 2015-06-14 (×4): qty 1

## 2015-06-14 MED ORDER — 0.9 % SODIUM CHLORIDE (POUR BTL) OPTIME
TOPICAL | Status: DC | PRN
  Administered 2015-06-14: 1000 mL

## 2015-06-14 MED ORDER — LACTATED RINGERS IV SOLN
INTRAVENOUS | Status: DC | PRN
  Administered 2015-06-14 (×2): via INTRAVENOUS

## 2015-06-14 MED ORDER — FENTANYL CITRATE (PF) 100 MCG/2ML IJ SOLN
INTRAMUSCULAR | Status: DC | PRN
  Administered 2015-06-14 (×3): 100 ug via INTRAVENOUS
  Administered 2015-06-14: 50 ug via INTRAVENOUS

## 2015-06-14 MED ORDER — ZOLPIDEM TARTRATE 5 MG PO TABS: 5.0000 mg | ORAL_TABLET | Freq: Every evening | ORAL | Status: DC | PRN

## 2015-06-14 MED ORDER — LIDOCAINE HCL (CARDIAC) 20 MG/ML IV SOLN
INTRAVENOUS | Status: DC | PRN
  Administered 2015-06-14: 50 mg via INTRAVENOUS

## 2015-06-14 MED ORDER — SUCCINYLCHOLINE CHLORIDE 20 MG/ML IJ SOLN
INTRAMUSCULAR | Status: DC | PRN
  Administered 2015-06-14: 100 mg via INTRAVENOUS

## 2015-06-14 MED ORDER — ROCURONIUM BROMIDE 100 MG/10ML IV SOLN
INTRAVENOUS | Status: DC | PRN
  Administered 2015-06-14: 50 mg via INTRAVENOUS

## 2015-06-14 MED ORDER — MIDAZOLAM HCL 2 MG/2ML IJ SOLN
INTRAMUSCULAR | Status: AC
Start: 2015-06-14 — End: 2015-06-14
  Filled 2015-06-14: qty 2

## 2015-06-14 MED ORDER — SUGAMMADEX SODIUM 200 MG/2ML IV SOLN
INTRAVENOUS | Status: DC | PRN
  Administered 2015-06-14: 200 mg via INTRAVENOUS

## 2015-06-14 MED ORDER — FENTANYL CITRATE (PF) 100 MCG/2ML IJ SOLN
25.0000 ug | INTRAMUSCULAR | Status: DC | PRN
  Administered 2015-06-14 (×2): 50 ug via INTRAVENOUS

## 2015-06-14 MED ORDER — MENTHOL 3 MG MT LOZG: 1.0000 | LOZENGE | OROMUCOSAL | Status: DC | PRN

## 2015-06-14 MED ORDER — PHENOL 1.4 % MT LIQD
1.0000 | OROMUCOSAL | Status: DC | PRN
Start: 2015-06-14 — End: 2015-06-16

## 2015-06-14 MED ORDER — PROPOFOL 10 MG/ML IV BOLUS
INTRAVENOUS | Status: AC
  Filled 2015-06-14: qty 20

## 2015-06-14 MED ORDER — SODIUM CHLORIDE 0.9 % IV SOLN
INTRAVENOUS | Status: DC
  Administered 2015-06-14: 18:00:00 via INTRAVENOUS

## 2015-06-14 MED ORDER — ONDANSETRON HCL 4 MG/2ML IJ SOLN: 4.0000 mg | Freq: Once | INTRAMUSCULAR | Status: DC | PRN

## 2015-06-14 MED ORDER — FENTANYL CITRATE (PF) 100 MCG/2ML IJ SOLN
INTRAMUSCULAR | Status: AC
  Filled 2015-06-14: qty 2

## 2015-06-14 MED ORDER — ALUM & MAG HYDROXIDE-SIMETH 200-200-20 MG/5ML PO SUSP: 30.0000 mL | ORAL | Status: DC | PRN

## 2015-06-14 MED ORDER — HYDROMORPHONE HCL 1 MG/ML IJ SOLN
1.0000 mg | INTRAMUSCULAR | Status: DC | PRN
  Administered 2015-06-14: 1 mg via INTRAVENOUS
  Filled 2015-06-14: qty 1

## 2015-06-14 MED ORDER — MIDAZOLAM HCL 5 MG/5ML IJ SOLN
INTRAMUSCULAR | Status: DC | PRN
  Administered 2015-06-14: 2 mg via INTRAVENOUS

## 2015-06-14 MED ORDER — ACETAMINOPHEN 650 MG RE SUPP: 650.0000 mg | Freq: Four times a day (QID) | RECTAL | Status: DC | PRN

## 2015-06-14 MED ORDER — TRANEXAMIC ACID 1000 MG/10ML IV SOLN
1000.0000 mg | INTRAVENOUS | Status: AC
  Administered 2015-06-14: 1000 mg via INTRAVENOUS
  Filled 2015-06-14: qty 10

## 2015-06-14 MED ORDER — METOCLOPRAMIDE HCL 10 MG PO TABS: 5.0000 mg | ORAL_TABLET | Freq: Three times a day (TID) | ORAL | Status: DC | PRN

## 2015-06-14 MED ORDER — DIPHENHYDRAMINE HCL 12.5 MG/5ML PO ELIX: 12.5000 mg | ORAL_SOLUTION | ORAL | Status: DC | PRN

## 2015-06-14 MED ORDER — CEFAZOLIN SODIUM-DEXTROSE 2-3 GM-% IV SOLR
2.0000 g | INTRAVENOUS | Status: AC
  Administered 2015-06-14: 2 g via INTRAVENOUS

## 2015-06-14 MED ORDER — SODIUM CHLORIDE 0.9 % IR SOLN
Status: DC | PRN
  Administered 2015-06-14: 1000 mL

## 2015-06-14 MED ORDER — PHENYLEPHRINE 40 MCG/ML (10ML) SYRINGE FOR IV PUSH (FOR BLOOD PRESSURE SUPPORT)
PREFILLED_SYRINGE | INTRAVENOUS | Status: AC
  Filled 2015-06-14: qty 20

## 2015-06-14 MED ORDER — CEFAZOLIN SODIUM-DEXTROSE 2-3 GM-% IV SOLR
INTRAVENOUS | Status: AC
  Filled 2015-06-14: qty 50

## 2015-06-14 MED ORDER — CHLORHEXIDINE GLUCONATE 4 % EX LIQD: 60.0000 mL | Freq: Once | CUTANEOUS | Status: DC

## 2015-06-14 MED ORDER — CEFAZOLIN SODIUM 1-5 GM-% IV SOLN
1.0000 g | Freq: Four times a day (QID) | INTRAVENOUS | Status: AC
  Administered 2015-06-14 – 2015-06-15 (×2): 1 g via INTRAVENOUS
  Filled 2015-06-14 (×2): qty 50

## 2015-06-14 MED ORDER — DOCUSATE SODIUM 100 MG PO CAPS
100.0000 mg | ORAL_CAPSULE | Freq: Two times a day (BID) | ORAL | Status: DC
  Administered 2015-06-14 – 2015-06-16 (×4): 100 mg via ORAL

## 2015-06-14 MED ORDER — METHOCARBAMOL 1000 MG/10ML IJ SOLN
500.0000 mg | Freq: Four times a day (QID) | INTRAVENOUS | Status: DC | PRN
  Administered 2015-06-14: 500 mg via INTRAVENOUS
  Filled 2015-06-14 (×2): qty 5

## 2015-06-14 MED ORDER — METOCLOPRAMIDE HCL 5 MG/ML IJ SOLN
5.0000 mg | Freq: Three times a day (TID) | INTRAMUSCULAR | Status: DC | PRN
  Administered 2015-06-14: 10 mg via INTRAVENOUS
  Filled 2015-06-14: qty 2

## 2015-06-14 MED ORDER — ONDANSETRON HCL 4 MG PO TABS: 4.0000 mg | ORAL_TABLET | Freq: Four times a day (QID) | ORAL | Status: DC | PRN

## 2015-06-14 SURGICAL SUPPLY — 36 items
APL SKNCLS STERI-STRIP NONHPOA (GAUZE/BANDAGES/DRESSINGS)
BAG SPEC THK2 15X12 ZIP CLS (MISCELLANEOUS)
BAG ZIPLOCK 12X15 (MISCELLANEOUS) IMPLANT
BENZOIN TINCTURE PRP APPL 2/3 (GAUZE/BANDAGES/DRESSINGS) IMPLANT
BLADE SAW SGTL 18X1.27X75 (BLADE) ×2 IMPLANT
CAPT HIP TOTAL 2 ×2 IMPLANT
CELLS DAT CNTRL 66122 CELL SVR (MISCELLANEOUS) ×1 IMPLANT
CLOTH BEACON ORANGE TIMEOUT ST (SAFETY) ×2 IMPLANT
DRAPE STERI IOBAN 125X83 (DRAPES) ×2 IMPLANT
DRAPE U-SHAPE 47X51 STRL (DRAPES) ×4 IMPLANT
DRSG AQUACEL AG ADV 3.5X10 (GAUZE/BANDAGES/DRESSINGS) ×2 IMPLANT
DURAPREP 26ML APPLICATOR (WOUND CARE) ×2 IMPLANT
ELECT REM PT RETURN 9FT ADLT (ELECTROSURGICAL) ×2
ELECTRODE REM PT RTRN 9FT ADLT (ELECTROSURGICAL) ×1 IMPLANT
GAUZE XEROFORM 1X8 LF (GAUZE/BANDAGES/DRESSINGS) IMPLANT
GLOVE BIO SURGEON STRL SZ7.5 (GLOVE) ×8 IMPLANT
GLOVE BIOGEL PI IND STRL 8 (GLOVE) ×2 IMPLANT
GLOVE BIOGEL PI INDICATOR 8 (GLOVE) ×2
GLOVE ECLIPSE 8.0 STRL XLNG CF (GLOVE) ×2 IMPLANT
GOWN STRL REUS W/TWL XL LVL3 (GOWN DISPOSABLE) ×8 IMPLANT
HANDPIECE INTERPULSE COAX TIP (DISPOSABLE) ×2
HOLDER FOLEY CATH W/STRAP (MISCELLANEOUS) ×2 IMPLANT
PACK ANTERIOR HIP CUSTOM (KITS) ×2 IMPLANT
PIN SECTOR W/GRIP ACE CUP 52MM (Hips) IMPLANT
RTRCTR WOUND ALEXIS 18CM MED (MISCELLANEOUS) ×2
SET HNDPC FAN SPRY TIP SCT (DISPOSABLE) ×1 IMPLANT
STAPLER VISISTAT 35W (STAPLE) IMPLANT
STRIP CLOSURE SKIN 1/2X4 (GAUZE/BANDAGES/DRESSINGS) IMPLANT
SUT ETHIBOND NAB CT1 #1 30IN (SUTURE) ×2 IMPLANT
SUT MNCRL AB 4-0 PS2 18 (SUTURE) ×2 IMPLANT
SUT VIC AB 0 CT1 36 (SUTURE) ×2 IMPLANT
SUT VIC AB 1 CT1 36 (SUTURE) ×2 IMPLANT
SUT VIC AB 2-0 CT1 27 (SUTURE) ×4
SUT VIC AB 2-0 CT1 TAPERPNT 27 (SUTURE) ×2 IMPLANT
TRAY FOLEY W/METER SILVER 14FR (SET/KITS/TRAYS/PACK) IMPLANT
TRAY FOLEY W/METER SILVER 16FR (SET/KITS/TRAYS/PACK) IMPLANT

## 2015-06-14 NOTE — Brief Op Note (Signed)
06/14/2015  4:02 PM  PATIENT:  Prudy FeelerJackie Weaver Jr  45 y.o. male  PRE-OPERATIVE DIAGNOSIS:  avascular necrosis right hip  POST-OPERATIVE DIAGNOSIS:  avascular necrosis right hip  PROCEDURE:  Procedure(s): RIGHT TOTAL HIP ARTHROPLASTY ANTERIOR APPROACH (Right)  SURGEON:  Surgeon(s) and Role:    * Kathryne Hitchhristopher Y Hyun Marsalis, MD - Primary  PHYSICIAN ASSISTANT: Rexene EdisonGil Clark, PA-C  ANESTHESIA:   general  EBL:  Total I/O In: 1000 [I.V.:1000] Out: 100 [Blood:100]  COUNTS:  YES  TOURNIQUET:  * No tourniquets in log *  DICTATION: .Other Dictation: Dictation Number [id number]  PLAN OF CARE: Admit to inpatient   PATIENT DISPOSITION:  PACU - hemodynamically stable.   Delay start of Pharmacological VTE agent (>24hrs) due to surgical blood loss or risk of bleeding: no

## 2015-06-14 NOTE — Transfer of Care (Signed)
Immediate Anesthesia Transfer of Care Note  Patient: Eric Johnston  Procedure(s) Performed: Procedure(s): RIGHT TOTAL HIP ARTHROPLASTY ANTERIOR APPROACH (Right)  Patient Location: PACU  Anesthesia Type:General  Level of Consciousness: awake, alert  and oriented  Airway & Oxygen Therapy: Patient Spontanous Breathing and Patient connected to face mask oxygen  Post-op Assessment: Report given to RN and Post -op Vital signs reviewed and stable  Post vital signs: Reviewed and stable  Last Vitals:  Filed Vitals:   06/14/15 1221  BP: 128/97  Pulse: 67  Temp: 37.2 C  Resp: 16    Complications: No apparent anesthesia complications

## 2015-06-14 NOTE — H&P (Signed)
TOTAL HIP ADMISSION H&P  Patient is admitted for right total hip arthroplasty.  Subjective:  Chief Complaint: right hip pain  HPI: Eric Johnston, 45 y.o. male, has a history of pain and functional disability in the right hip(s) due to idiopathic avascular necrosis and patient has failed non-surgical conservative treatments for greater than 12 weeks to include NSAID's and/or analgesics, corticosteriod injections, flexibility and strengthening excercises, use of assistive devices, weight reduction as appropriate and activity modification.  Onset of symptoms was abrupt starting 2 years ago with rapidlly worsening course since that time.The patient noted no past surgery on the right hip(s).  Patient currently rates pain in the right hip at 10 out of 10 with activity. Patient has night pain, worsening of pain with activity and weight bearing, trendelenberg gait, pain that interfers with activities of daily living and pain with passive range of motion. Patient has evidence of subchondral cysts, subchondral sclerosis, periarticular osteophytes and joint space narrowing by imaging studies. This condition presents safety issues increasing the risk of falls.  There is no current active infection.  Patient Active Problem List   Diagnosis Date Noted  . Osteoarthritis of right hip 06/14/2015   Past Medical History  Diagnosis Date  . Hypertension     WAS TAKEN OFF BP MEDS BY PCP  . Avascular necrosis (HCC)     RT HIP    Past Surgical History  Procedure Laterality Date  . Peridontal surgery      No prescriptions prior to admission   No Known Allergies  Social History  Substance Use Topics  . Smoking status: Never Smoker   . Smokeless tobacco: Not on file  . Alcohol Use: No    No family history on file.   ROS  Objective:  Physical Exam  Vital signs in last 24 hours:    Labs:   Estimated body mass index is 25.14 kg/(m^2) as calculated from the following:   Height as of 04/11/13: 5'  8" (1.727 m).   Weight as of 04/12/13: 74.985 kg (165 lb 5 oz).   Imaging Review Plain radiographs demonstrate severe avascular necrosis of the right hip(s). The bone quality appears to be good for age and reported activity level.  Assessment/Plan:  Idiopathic avascular necrosis, right hip(s)  The patient history, physical examination, clinical judgement of the provider and imaging studies are consistent with end stage AVN of the right hip(s) and total hip arthroplasty is deemed medically necessary. The treatment options including medical management, injection therapy, arthroscopy and arthroplasty were discussed at length. The risks and benefits of total hip arthroplasty were presented and reviewed. The risks due to aseptic loosening, infection, stiffness, dislocation/subluxation,  thromboembolic complications and other imponderables were discussed.  The patient acknowledged the explanation, agreed to proceed with the plan and consent was signed. Patient is being admitted for inpatient treatment for surgery, pain control, PT, OT, prophylactic antibiotics, VTE prophylaxis, progressive ambulation and ADL's and discharge planning.The patient is planning to be discharged home with home health services

## 2015-06-14 NOTE — Progress Notes (Signed)
Dr Renold DonGermeroth at bedside.  Aware of elevated BP.  No orders received.  May tx pt to room at this time

## 2015-06-14 NOTE — Anesthesia Preprocedure Evaluation (Addendum)
Anesthesia Evaluation  Patient identified by MRN, date of birth, ID band Patient awake    Reviewed: Allergy & Precautions, NPO status , Patient's Chart, lab work & pertinent test results  History of Anesthesia Complications Negative for: history of anesthetic complications  Airway Mallampati: II  TM Distance: >3 FB Neck ROM: Full    Dental no notable dental hx. (+) Dental Advisory Given   Pulmonary neg pulmonary ROS,    Pulmonary exam normal breath sounds clear to auscultation       Cardiovascular hypertension, Normal cardiovascular exam Rhythm:Regular Rate:Normal     Neuro/Psych negative neurological ROS  negative psych ROS   GI/Hepatic negative GI ROS, Neg liver ROS,   Endo/Other  negative endocrine ROS  Renal/GU negative Renal ROS  negative genitourinary   Musculoskeletal negative musculoskeletal ROS (+)   Abdominal   Peds negative pediatric ROS (+)  Hematology negative hematology ROS (+)   Anesthesia Other Findings   Reproductive/Obstetrics negative OB ROS                            Anesthesia Physical Anesthesia Plan  ASA: II  Anesthesia Plan: General   Post-op Pain Management:    Induction: Intravenous  Airway Management Planned: Oral ETT  Additional Equipment:   Intra-op Plan:   Post-operative Plan: Extubation in OR  Informed Consent: I have reviewed the patients History and Physical, chart, labs and discussed the procedure including the risks, benefits and alternatives for the proposed anesthesia with the patient or authorized representative who has indicated his/her understanding and acceptance.   Dental advisory given  Plan Discussed with: CRNA  Anesthesia Plan Comments:        Anesthesia Quick Evaluation

## 2015-06-14 NOTE — Anesthesia Procedure Notes (Signed)
Procedure Name: Intubation Performed by: Kizzie FantasiaARVER, Kambry Takacs J Pre-anesthesia Checklist: Patient identified, Emergency Drugs available, Suction available, Patient being monitored and Timeout performed Patient Re-evaluated:Patient Re-evaluated prior to inductionOxygen Delivery Method: Circle system utilized Preoxygenation: Pre-oxygenation with 100% oxygen Intubation Type: IV induction Ventilation: Mask ventilation without difficulty Laryngoscope Size: Mac and 4 Grade View: Grade I Tube type: Oral Tube size: 7.5 mm Number of attempts: 2 Airway Equipment and Method: Stylet Placement Confirmation: ETT inserted through vocal cords under direct vision,  positive ETCO2,  CO2 detector and breath sounds checked- equal and bilateral Secured at: 23 cm Tube secured with: Tape Dental Injury: Teeth and Oropharynx as per pre-operative assessment

## 2015-06-15 LAB — BASIC METABOLIC PANEL
ANION GAP: 7 (ref 5–15)
BUN: 8 mg/dL (ref 6–20)
CHLORIDE: 104 mmol/L (ref 101–111)
CO2: 26 mmol/L (ref 22–32)
Calcium: 8.6 mg/dL — ABNORMAL LOW (ref 8.9–10.3)
Creatinine, Ser: 0.89 mg/dL (ref 0.61–1.24)
GFR calc Af Amer: >60 mL/min (ref >60)
GLUCOSE: 119 mg/dL — AB (ref 65–99)
POTASSIUM: 4.2 mmol/L (ref 3.5–5.1)
SODIUM: 137 mmol/L (ref 135–145)

## 2015-06-15 LAB — CBC
HCT: 38 % — ABNORMAL LOW (ref 39.0–52.0)
Hemoglobin: 11.7 g/dL — ABNORMAL LOW (ref 13.0–17.0)
MCH: 25 pg — AB (ref 26.0–34.0)
MCHC: 30.8 g/dL (ref 30.0–36.0)
MCV: 81.2 fL (ref 78.0–100.0)
PLATELETS: 173 10*3/uL (ref 150–400)
RBC: 4.68 MIL/uL (ref 4.22–5.81)
RDW: 14.2 % (ref 11.5–15.5)
WBC: 9.1 10*3/uL (ref 4.0–10.5)

## 2015-06-15 NOTE — Care Management Note (Signed)
Case Management Note  Patient Details  Name: Prudy FeelerJackie Weaver Jr MRN: [id number] Date of Birth: [birth year]  Subjective/Objective:       RIGHT TOTAL HIP ARTHROPLASTY             Action/Plan:  NCM spoke to pt. States wife at home to assist with his care. [facility] Has RW and 3n1 at home.    Expected Discharge Date:  06/15/2015              Expected Discharge Plan:  Home w Home Health Services  In-House Referral:  NA  Discharge planning Services  CM Consult  Post Acute Care Choice:  Home Health Choice offered to:  Patient  DME Arranged:  N/A DME Agency:  NA  HH Arranged:  [facility]  Status of Service:  Completed, signed off  Medicare Important Message Given:    Date Medicare IM Given:    Medicare IM give by:    Date Additional Medicare IM Given:    Additional Medicare Important Message give by:     If discussed at Long Length of Stay Meetings, dates discussed:    Additional Comments:  Elliot CousinShavis, Terresa Marlett Ellen, RN 06/15/2015, 6:59 PM

## 2015-06-15 NOTE — Anesthesia Postprocedure Evaluation (Signed)
Anesthesia Post Note  Patient: Eric Johnston  Procedure(s) Performed: Procedure(s) (LRB): RIGHT TOTAL HIP ARTHROPLASTY ANTERIOR APPROACH (Right)  Anesthesia Type: General Level of consciousness: awake and alert Pain management: pain level controlled Vital Signs Assessment: post-procedure vital signs reviewed and stable Respiratory status: spontaneous breathing, nonlabored ventilation, respiratory function stable and patient connected to nasal cannula oxygen Cardiovascular status: blood pressure returned to baseline and stable Postop Assessment: no signs of nausea or vomiting Anesthetic complications: no    Last Vitals:  Filed Vitals:   06/15/15 0100 06/15/15 0420  BP: 139/3 132/92  Pulse: 83 80  Temp: 37 C 37.6 C  Resp: 16 20    Last Pain:  Filed Vitals:   06/15/15 0646  PainSc: Asleep                 [name]

## 2015-06-15 NOTE — Evaluation (Signed)
Physical Therapy Evaluation Patient Details Name: Eric FeelerJackie Johnston Eric Johnston MRN: [id number] DOB: [birth year] Today's Date: 06/15/2015   History of Present Illness  R THR  Clinical Impression  Pt s/p R THR presents with decreased R LE strength/ROM and post op pain limiting functional mobility.  Pt should progress well to dc home with family assist and HHPT follow up.    Follow Up Recommendations Home health PT    Equipment Recommendations  None recommended by PT    Recommendations for Other Services OT consult     Precautions / Restrictions Precautions Precautions: Fall Restrictions Weight Bearing Restrictions: No Other Position/Activity Restrictions: WBAT      Mobility  Bed Mobility Overal bed mobility: Needs Assistance Bed Mobility: Supine to Sit     Supine to sit: Min guard     General bed mobility comments: cues for sequence and use of L LE to self assist  Transfers Overall transfer level: Needs assistance Equipment used: Rolling walker (2 wheeled) Transfers: Sit to/from Stand Sit to Stand: Min guard         General transfer comment: cues for LE management and use of UEs to self assist  Ambulation/Gait Ambulation/Gait assistance: Min assist;Min guard Ambulation Distance (Feet): 180 Feet Assistive device: Rolling walker (2 wheeled) Gait Pattern/deviations: Step-to pattern;Step-through pattern;Decreased step length - right;Decreased step length - left;Shuffle;Trunk flexed Gait velocity: decr   General Gait Details: cues for sequence, posture and position from [employer]  Stairs            Wheelchair Mobility    Modified Rankin (Stroke Patients Only)       Balance                                             Pertinent Vitals/Pain Pain Assessment: 0-10 Pain Score: 4  Pain Location: R hip Pain Descriptors / Indicators: Aching;Tightness Pain Intervention(s): Limited activity within patient's tolerance;Monitored during session;Premedicated  before session;Ice applied    Home Living Family/patient expects to be discharged to:: Private residence Living Arrangements: Spouse/significant other Available Help at Discharge: Family Type of Home: House Home Access: Stairs to enter Entrance Stairs-Rails: None Entrance Stairs-Number of Steps: 1 Home Layout: Two level Home Equipment: Environmental consultantWalker - 2 wheels;Crutches      Prior Function Level of Independence: Independent               Hand Dominance   Dominant Hand: Right    Extremity/Trunk Assessment   Upper Extremity Assessment: Overall WFL for tasks assessed           Lower Extremity Assessment: LLE deficits/detail      Cervical / Trunk Assessment: Normal  Communication   Communication: No difficulties  Cognition Arousal/Alertness: Awake/alert Behavior During Therapy: WFL for tasks assessed/performed Overall Cognitive Status: Within Functional Limits for tasks assessed                      General Comments      Exercises        Assessment/Plan    PT Assessment Patient needs continued PT services  PT Diagnosis Difficulty walking   PT Problem List Decreased strength;Decreased range of motion;Decreased activity tolerance;Decreased mobility;Decreased knowledge of use of DME;Pain  PT Treatment Interventions DME instruction;Gait training;Stair training;Functional mobility training;Therapeutic activities;Therapeutic exercise;Patient/family education   PT Goals (Current goals can be found in the Care Plan section) Acute Rehab PT  Goals Patient Stated Goal: Resume previous lifestyle with decreased pain PT Goal Formulation: With patient Time For Goal Achievement: 06/17/15 Potential to Achieve Goals: Good    Frequency 7X/week   Barriers to discharge        Co-evaluation               End of Session Equipment Utilized During Treatment: Gait belt Activity Tolerance: Patient tolerated treatment well Patient left: in chair;with call  bell/phone within reach;with chair alarm set Nurse Communication: Mobility status         Time: 7829-5621 PT Time Calculation (min) (ACUTE ONLY): 30 min   Charges:   PT Evaluation $PT Eval Low Complexity: 1 Procedure PT Treatments $Gait Training: 8-22 mins   PT G Codes:        Eric Eric Johnston 07/01/15, 12:33 PM

## 2015-06-15 NOTE — Op Note (Signed)
NAME:  Prudy FeelerWEAVER JR, Chevy            [place] NO.:  [id number]  MEDICAL RECORD NO.:  [id number]  LOCATION:  1611                         FACILITY[facility]  [facility]  PHYSICIAN:  Vanita PandaChristopher Y. Magnus IvanBlackman, M.D.DATE OF BIRTH:  05/21/1970  DATE OF PROCEDURE:  06/14/2015 DATE OF DISCHARGE:                              OPERATIVE REPORT   PREOPERATIVE DIAGNOSIS:  Idiopathic avascular necrosis, right hip.  POSTOPERATIVE DIAGNOSIS:  Idiopathic avascular necrosis, right hip.  PROCEDURE:  Right total hip arthroplasty through direct anterior approach.  IMPLANTS:  [employer] Gription acetabular component size 54, 2 screws, size 36+ 4 polyethylene liner, size 12 Corail femoral component with varus offset, size 36+ 5 ceramic hip ball.  SURGEON:  Vanita PandaChristopher Y. Magnus IvanBlackman, MD  ASSISTANT:  Richardean CanalGilbert Clark, PA-C  ANESTHESIA:  General.  ANTIBIOTICS:  2 g IV Ancef.  BLOOD LOSS:  Less than 500 mL.  COMPLICATIONS:  None.  INDICATIONS:  Eric Johnston is a 45 year old gentleman, with about 2 years of worsening right hip pain.  This slowly became acute and rapidly hurting him, has detrimentally affected his activities of daily living, his quality of life, and his mobility.  X-rays show cystic changes in the femoral head and the acetabulum.  There was complete loss of the joint space and a femoral head collapse.  This pain was severe.  At this point, we recommended total hip arthroplasty.  The risks and benefits of surgery were explained to him in detail including the risk of acute blood loss anemia, nerve and vessel injury, fracture, infection, dislocation, DVT.  He understands our goals are decreased pain, improved mobility, and overall improved quality of life.  PROCEDURE DESCRIPTION:  After informed consent was obtained, appropriate right hip was marked.  He was brought to the operating room, where general anesthesia was obtained while he was on a stretcher.  Traction boots were placed on both his feet.  Next, he  was placed supine on the Hana fracture table with the perineal post in place and both legs in inline skeletal traction devices, but no traction applied.  His right operative hip was prepped and draped with DuraPrep and sterile drapes. A time-out was called and he was identified as correct patient, correct right hip.  We then made incision inferior and posterior to the anterior superior iliac spine and carried this obliquely down the leg.  We dissected down the tensor fascia lata muscle and the tensor fascia was then divided longitudinally, so we could proceed with a direct anterior approach to the hip.  We identified and cauterized the lateral femoral circumflex vessels and then identified the hip capsule.  We opened up the hip capsule in an L-type format finding a very large joint effusion and severe changes in the femoral head and neck.  We placed Cobra retractors around the medial and lateral femoral neck and using oscillating saw, made our femoral neck cut proximal to the lesser trochanter and completed this on osteotome.  We placed a corkscrew guide in the femoral head and removed the femoral head in its entirety and found it to be collapsed and devoid of cartilage as well.  We then placed a bent Hohmann over the medial acetabular rim and cleaned remnants of  acetabular labrum.  I then began reaming under direct visualization from a size 42 reamer up to a size 52 with the last reamer under direct visualization and direct fluoroscopy.  We felt that was a great fit.  I was pleased under direct fluoroscopy to gain my depth and reaming, our inclination, and anteversion.  Then, we tried to seat the real cup once we opened it up, I just could not get it to seat.  I cleaned the soft tissue, and tried a 2nd occasion to get the seat.  I then needed to go up on a size, so I was able to ream up to a size 53 and then placed the 54 under direct visualization and fluoroscopy and that had a nice  tight fit.  We still placed two supplemental screws as well.  We then placed the real 36+ 4 polyethylene liner for a size 54 acetabular component.  Attention was then turned to the femur with the leg externally rotated to 100 degrees extended and abducted.  We were able to place a Mueller retractor medially and Hohmann retractor behind the greater trochanter.  We released the lateral joint capsule and used a box cutting osteotome to enter femoral canal and a rongeur to lateralize.  We then began broaching from a size 8 broached up to a size 12.  Based off his high offset, we trialed a varus offset femoral neck and a 36+ 1.5 hip ball.  We brought the leg back over and up with traction and internal rotation, reducing the pelvis.  We were pleased with stability, but we felt like we could use a little bit more leg length.  We dislocated the hip, and removed all trial components.  We placed the real Corail femoral component with varus offset and the real 36+ 5 ceramic hip ball and reduced this in the pelvis.  At this time, we were definitely pleased with leg length and offset as well as stability. We then irrigated the soft tissues with normal saline solution using pulsatile lavage.  We were able to close the joint capsule with interrupted #1 Ethibond suture followed by running #1 Vicryl in the tensor fascia, 0 Vicryl in the deep tissue, 2-0 Vicryl in subcutaneous tissue, 4-0 Monocryl subcuticular stitch, and Steri-Strips on the skin. An Aquacel dressing was applied.  He was taken off the Hana table, awakened, extubated and taken to the recovery room in stable condition. All final counts were correct.  There were no complications noted.  Of note, Richardean Canal, PA-C assisted in the entire case.  His assistance was crucial for facilitating all aspects of this case.     Vanita Panda. Magnus Ivan, M.D.     CYB/MEDQ  D:  06/14/2015  T:  06/15/2015  Job:  [id number]

## 2015-06-15 NOTE — Progress Notes (Signed)
Utilization review completed.  

## 2015-06-15 NOTE — Progress Notes (Signed)
Physical Therapy Treatment Patient Details Name: Prudy FeelerJackie Weaver Jr MRN: [id number] DOB: 1970/05/11 Today's Date: 06/15/2015    History of Present Illness R THR    PT Comments    Pt very motivated and progressing well but cautioned about overdoing activity level.  Pt hopeful for dc tomorrow.  Follow Up Recommendations  Home health PT     Equipment Recommendations  None recommended by PT    Recommendations for Other Services OT consult     Precautions / Restrictions Precautions Precautions: Fall Restrictions Weight Bearing Restrictions: No Other Position/Activity Restrictions: WBAT    Mobility  Bed Mobility               General bed mobility comments: NT  Transfers Overall transfer level: Needs assistance Equipment used: Rolling walker (2 wheeled) Transfers: Sit to/from Stand Sit to Stand: Supervision         General transfer comment: demonstrates good safety awareness; min cues for use of UEs to self assist  Ambulation/Gait         Gait velocity: decr Gait velocity interpretation: Below normal speed for age/gender General Gait Details: NT - pt ambulating in hall with spouse.   Stairs            Wheelchair Mobility    Modified Rankin (Stroke Patients Only)       Balance                                    Cognition Arousal/Alertness: Awake/alert Behavior During Therapy: WFL for tasks assessed/performed Overall Cognitive Status: Within Functional Limits for tasks assessed                      Exercises Total Joint Exercises Ankle Circles/Pumps: AROM;Both;20 reps;Supine Quad Sets: AROM;Both;10 reps;Supine Heel Slides: AAROM;20 reps;Supine;Right Hip ABduction/ADduction: AAROM;Right;15 reps;Supine Long Arc Quad: AROM;Right;15 reps;Supine    General Comments        Pertinent Vitals/Pain Pain Assessment: 0-10 Pain Score: 5  Pain Location: R hip Pain Descriptors / Indicators: Aching;Tightness Pain  Intervention(s): Limited activity within patient's tolerance;Monitored during session;Premedicated before session;Ice applied    Home Living                      Prior Function            PT Goals (current goals can now be found in the care plan section) Acute Rehab PT Goals Patient Stated Goal: regain independence  PT Goal Formulation: With patient Time For Goal Achievement: 06/17/15 Potential to Achieve Goals: Good Progress towards PT goals: Progressing toward goals    Frequency  7X/week    PT Plan Current plan remains appropriate    Co-evaluation             End of Session Equipment Utilized During Treatment: Gait belt Activity Tolerance: Patient tolerated treatment well Patient left: in chair;with call bell/phone within reach;with chair alarm set     Time: 1535-1600 PT Time Calculation (min) (ACUTE ONLY): 25 min  Charges:  $Therapeutic Exercise: 23-37 mins                    G Codes:      Shravya Wickwire 06/15/2015, 5:01 PM

## 2015-06-15 NOTE — Evaluation (Signed)
Occupational Therapy Evaluation Patient Details Name: Eric FeelerJackie Weaver Johnston MRN: [id number] DOB: 1971-02-01 Today's Date: 06/15/2015    History of Present Illness R THR   Clinical Impression   Patient evaluated by Occupational Therapy with no further acute OT needs identified. All education has been completed and the patient has no further questions. Pt requires min A to don Rt sock due to pain and Rt LE limitations.  Otherwise, he is able to perform ADLs at supervision level.  All education complted. See below for any follow-up Occupational Therapy or equipment needs. OT is signing off. Thank you for this referral.      Follow Up Recommendations  No OT follow up;Supervision - Intermittent    Equipment Recommendations  None recommended by OT    Recommendations for Other Services       Precautions / Restrictions Precautions Precautions: Fall Restrictions Weight Bearing Restrictions: No Other Position/Activity Restrictions: WBAT      Mobility Bed Mobility Overal bed mobility: Needs Assistance Bed Mobility: Supine to Sit     Supine to sit: Min guard     General bed mobility comments: cues for sequence and use of L LE to self assist  Transfers Overall transfer level: Needs assistance Equipment used: Rolling walker (2 wheeled) Transfers: Sit to/from UGI CorporationStand;Stand Pivot Transfers Sit to Stand: Supervision Stand pivot transfers: Supervision       General transfer comment: demonstrates good safety awareness     Balance Overall balance assessment: Needs assistance Sitting-balance support: Feet supported Sitting balance-Leahy Scale: Good     Standing balance support: During functional activity Standing balance-Leahy Scale: Fair                              ADL Overall ADL's : Needs assistance/impaired                                       General ADL Comments: Pt requires min A to don Rt sock, is able to perform the remainder of bathing  and dressing with supervision.  Reviewed safe techniques with pt and spouse.  She will be available to assist him as needed at discharge.      Vision     Perception     Praxis      Pertinent Vitals/Pain Pain Assessment: 0-10 Pain Score: 4  Pain Location: Rt hip  Pain Descriptors / Indicators: Tightness Pain Intervention(s): Monitored during session     Hand Dominance Right   Extremity/Trunk Assessment Upper Extremity Assessment Upper Extremity Assessment: Overall WFL for tasks assessed   Lower Extremity Assessment Lower Extremity Assessment: Defer to PT evaluation   Cervical / Trunk Assessment Cervical / Trunk Assessment: Normal   Communication Communication Communication: No difficulties   Cognition Arousal/Alertness: Awake/alert Behavior During Therapy: WFL for tasks assessed/performed Overall Cognitive Status: Within Functional Limits for tasks assessed                     General Comments       Exercises       Shoulder Instructions      Home Living Family/patient expects to be discharged to:: Private residence Living Arrangements: Spouse/significant other Available Help at Discharge: Family Type of Home: House Home Access: Stairs to enter Secretary/administratorntrance Stairs-Number of Steps: 1 Entrance Stairs-Rails: None Home Layout: Two level Alternate Level Stairs-Number of Steps: 20 Alternate Level  Stairs-Rails: Left Bathroom Shower/Tub: Producer, television/film/video: Handicapped height     Home Equipment: Environmental consultant - 2 wheels;Crutches          Prior Functioning/Environment Level of Independence: Independent        Comments: Pt works full time in retail     OT Diagnosis: Generalized weakness;Acute pain   OT Problem List: Decreased strength;Pain;Impaired balance (sitting and/or standing)   OT Treatment/Interventions:      OT Goals(Current goals can be found in the care plan section) Acute Rehab OT Goals Patient Stated Goal: regain  independence  OT Goal Formulation: All assessment and education complete, DC therapy  OT Frequency:     Barriers to D/C:            Co-evaluation              End of Session Equipment Utilized During Treatment: Rolling walker Nurse Communication: Mobility status  Activity Tolerance: Patient tolerated treatment well Patient left: in chair;with call bell/phone within reach;with family/visitor present   Time: 1012-1034 OT Time Calculation (min): 22 min Charges:  OT General Charges $OT Visit: 1 Procedure OT Evaluation $OT Eval Low Complexity: 1 Procedure G-Codes:    Linlee Cromie M 06/26/15, 12:48 PM

## 2015-06-15 NOTE — Progress Notes (Signed)
Subjective: 1 Day Post-Op Procedure(s) (LRB): RIGHT TOTAL HIP ARTHROPLASTY ANTERIOR APPROACH (Right) Patient reports pain as mild.  Already up with PT and doing well.  Objective: Vital signs in last 24 hours: Temp:  [98 F (36.7 C)-99.6 F (37.6 C)] 98.5 F (36.9 C) (03/11 0954) Pulse Rate:  [67-94] 86 (03/11 0954) Resp:  [13-20] 20 (03/11 0954) BP: (128-183)/(3-116) 136/77 mmHg (03/11 0954) SpO2:  [100 %] 100 % (03/11 0954) Weight:  [75.751 kg (167 lb)] 75.751 kg (167 lb) (03/10 1809)  Intake/Output from previous day: 03/10 0701 - 03/11 0700 In: 1005 [P.O.:5; I.V.:1000] Out: 1200 [Urine:1100; Blood:100] Intake/Output this shift: Total I/O In: 240 [P.O.:240] Out: 700 [Urine:700]   Recent Labs  06/15/15 0428  HGB 11.7*    Recent Labs  06/15/15 0428  WBC 9.1  RBC 4.68  HCT 38.0*  PLT 173    Recent Labs  06/15/15 0428  NA 137  K 4.2  CL 104  CO2 26  BUN 8  CREATININE 0.89  GLUCOSE 119*  CALCIUM 8.6*   No results for input(s): LABPT, INR in the last 72 hours.  Dorsiflexion/Plantar flexion intact Incision: dressing C/D/I  Assessment/Plan: 1 Day Post-Op Procedure(s) (LRB): RIGHT TOTAL HIP ARTHROPLASTY ANTERIOR APPROACH (Right) Up with therapy Plan for discharge tomorrow Discharge home with home health  Kathryne HitchBLACKMAN,CHRISTOPHER Y 06/15/2015, 10:30 AM

## 2015-06-16 MED ORDER — ASPIRIN 325 MG PO TBEC: 325.0000 mg | DELAYED_RELEASE_TABLET | Freq: Two times a day (BID) | ORAL | Status: DC

## 2015-06-16 MED ORDER — OXYCODONE-ACETAMINOPHEN 5-325 MG PO TABS: 1.0000 | ORAL_TABLET | ORAL | Status: DC | PRN

## 2015-06-16 MED ORDER — METHOCARBAMOL 500 MG PO TABS: 500.0000 mg | ORAL_TABLET | Freq: Four times a day (QID) | ORAL | Status: DC | PRN

## 2015-06-16 NOTE — Progress Notes (Signed)
Subjective: 2 Days Post-Op Procedure(s) (LRB): RIGHT TOTAL HIP ARTHROPLASTY ANTERIOR APPROACH (Right) Patient reports pain as moderate.    Objective: Vital signs in last 24 hours: Temp:  [98.5 F (36.9 C)-100.8 F (38.2 C)] 100.3 F (37.9 C) (03/12 0541) Pulse Rate:  [86-96] 88 (03/12 0541) Resp:  [20] 20 (03/12 0541) BP: (136-160)/(71-89) 159/89 mmHg (03/12 0541) SpO2:  [97 %-100 %] 97 % (03/12 0541)  Intake/Output from previous day: 03/11 0701 - 03/12 0700 In: 820 [P.O.:820] Out: 700 [Urine:700] Intake/Output this shift:     Recent Labs  06/15/15 0428  HGB 11.7*    Recent Labs  06/15/15 0428  WBC 9.1  RBC 4.68  HCT 38.0*  PLT 173    Recent Labs  06/15/15 0428  NA 137  K 4.2  CL 104  CO2 26  BUN 8  CREATININE 0.89  GLUCOSE 119*  CALCIUM 8.6*   No results for input(s): LABPT, INR in the last 72 hours.  Sensation intact distally Intact pulses distally Dorsiflexion/Plantar flexion intact Incision: dressing C/D/I  Assessment/Plan: 2 Days Post-Op Procedure(s) (LRB): RIGHT TOTAL HIP ARTHROPLASTY ANTERIOR APPROACH (Right) Up with therapy Discharge home with home health today  Kathryne HitchBLACKMAN,CHRISTOPHER Y 06/16/2015, 7:37 AM

## 2015-06-16 NOTE — Progress Notes (Signed)
Physical Therapy Treatment Patient Details Name: Eric Johnston MRN: [id number] DOB: [birth year] Today's Date: 06/16/2015    History of Present Illness R THR    PT Comments    Pt progressing well with mobility.  Reviewed stairs, therex and car transfers.  Follow Up Recommendations  Home health PT     Equipment Recommendations  None recommended by PT    Recommendations for Other Services OT consult     Precautions / Restrictions Precautions Precautions: Fall Restrictions Weight Bearing Restrictions: No Other Position/Activity Restrictions: WBAT    Mobility  Bed Mobility               General bed mobility comments: Pt declines to attempt.  States comfortable with ability  Transfers Overall transfer level: Needs assistance Equipment used: Rolling walker (2 wheeled) Transfers: Sit to/from Stand Sit to Stand: Supervision         General transfer comment: demonstrates good safety awareness  Ambulation/Gait Ambulation/Gait assistance: Supervision Ambulation Distance (Feet): 200 Feet Assistive device: Rolling walker (2 wheeled) Gait Pattern/deviations: Step-through pattern;Decreased step length - right;Decreased step length - left;Shuffle;Trunk flexed;Wide base of support Gait velocity: decr Gait velocity interpretation: Below normal speed for age/gender General Gait Details: min cues for posture and position from RW   Stairs Stairs: Yes Stairs assistance: Min guard Stair Management: One rail Left;Step to pattern;No rails;Forwards;With walker;With crutches Number of Stairs: 12 General stair comments: single step twice with RW and 10 steps with rail and crutch.  Cues for sequence and foot/crutch/RW placement  Wheelchair Mobility    Modified Rankin (Stroke Patients Only)       Balance                                    Cognition Arousal/Alertness: Awake/alert Behavior During Therapy: WFL for tasks assessed/performed Overall  Cognitive Status: Within Functional Limits for tasks assessed                      Exercises Total Joint Exercises Ankle Circles/Pumps: AROM;Both;20 reps;Supine Quad Sets: AROM;Both;10 reps;Supine Heel Slides: AAROM;20 reps;Supine;Right Hip ABduction/ADduction: AAROM;Right;15 reps;Supine Long Arc Quad: AROM;Right;15 reps;Supine    General Comments        Pertinent Vitals/Pain Pain Assessment: 0-10 Pain Score: 5  (up to 8/10 with stairs) Pain Location: R hip Pain Intervention(s): Limited activity within patient's tolerance;Monitored during session;Premedicated before session;Ice applied    Home Living                      Prior Function            PT Goals (current goals can now be found in the care plan section) Acute Rehab PT Goals Patient Stated Goal: regain independence  PT Goal Formulation: With patient Time For Goal Achievement: 06/17/15 Potential to Achieve Goals: Good Progress towards PT goals: Progressing toward goals    Frequency  7X/week    PT Plan Current plan remains appropriate    Co-evaluation             End of Session Equipment Utilized During Treatment: Gait belt Activity Tolerance: Patient tolerated treatment well Patient left: in chair;with call bell/phone within reach     Time: 0815-0845 PT Time Calculation (min) (ACUTE ONLY): 30 min  Charges:  $Gait Training: 8-22 mins $Therapeutic Exercise: 8-22 mins  G Codes:      Laurie Penado 07-07-2015, 8:58 AM

## 2015-06-16 NOTE — Discharge Instructions (Signed)

## 2015-06-16 NOTE — Progress Notes (Signed)
Pt discharged to home. Dc instructions given with male family member at bedside. No concerns voiced. Prescription for 3 meds given. Pt left unit in wheelchair pushed by nurse tech accompanied by male family member. Left in good condition. VWilliams,rn.

## 2015-06-16 NOTE — Discharge Summary (Signed)
Patient ID: Eric Johnston MRN: [id number] DOB/AGE: 1970-12-25 45 y.o.  Admit date: 06/14/2015 Discharge date: 06/16/2015  Admission Diagnoses:  Principal Problem:   Avascular necrosis of bone of right hip [facility]) Active Problems:   Status post total replacement of right hip   Discharge Diagnoses:  Same  Past Medical History  Diagnosis Date  . Hypertension     WAS TAKEN OFF BP MEDS BY PCP  . Avascular necrosis (HCC)     RT HIP    Surgeries: Procedure(s): RIGHT TOTAL HIP ARTHROPLASTY ANTERIOR APPROACH on 06/14/2015   Consultants:    Discharged Condition: Improved  Hospital Course: Eric Johnston is an 45 y.o. male who was admitted 06/14/2015 for operative treatment ofAvascular necrosis of bone of right hip (HCC). Patient has severe unremitting pain that affects sleep, daily activities, and work/hobbies. After pre-op clearance the patient was taken to the operating room on 06/14/2015 and underwent  Procedure(s): RIGHT TOTAL HIP ARTHROPLASTY ANTERIOR APPROACH.    Patient was given perioperative antibiotics: Anti-infectives    Start     Dose/Rate Route Frequency Ordered Stop   06/15/15 0600  ceFAZolin (ANCEF) IVPB 2 g/50 mL premix     2 g 100 mL/hr over 30 Minutes Intravenous On call to O.R. 06/14/15 1331 06/14/15 1431   06/14/15 2000  ceFAZolin (ANCEF) IVPB 1 g/50 mL premix     1 g 100 mL/hr over 30 Minutes Intravenous Every 6 hours 06/14/15 1713 06/15/15 0206       Patient was given sequential compression devices, early ambulation, and chemoprophylaxis to prevent DVT.  Patient benefited maximally from hospital stay and there were no complications.    Recent vital signs: Patient Vitals for the past 24 hrs:  BP Temp Temp src Pulse Resp SpO2  06/16/15 0541 (!) 159/89 mmHg 100.3 F (37.9 C) Oral 88 20 97 %  06/15/15 2118 (!) 160/71 mmHg (!) 100.8 F (38.2 C) Oral 96 20 99 %  06/15/15 1557 (!) 149/76 mmHg 99.5 F (37.5 C) Oral 96 20 100 %  06/15/15 0954 136/77 mmHg  98.5 F (36.9 C) Oral 86 20 100 %     Recent laboratory studies:  Recent Labs  06/15/15 0428  WBC 9.1  HGB 11.7*  HCT 38.0*  PLT 173  NA 137  K 4.2  CL 104  CO2 26  BUN 8  CREATININE 0.89  GLUCOSE 119*  CALCIUM 8.6*     Discharge Medications:     Medication List    STOP taking these medications        amoxicillin-clavulanate 875-125 MG tablet  Commonly known as:  AUGMENTIN      TAKE these medications        aspirin 325 MG EC tablet  Take 1 tablet (325 mg total) by mouth 2 (two) times daily after a meal.     COD LIVER OIL PO  Take 1 capsule by mouth daily.     meloxicam 7.5 MG tablet  Commonly known as:  MOBIC  Take 2 tablets (15 mg total) by mouth daily.     methocarbamol 500 MG tablet  Commonly known as:  ROBAXIN  Take 1 tablet (500 mg total) by mouth 2 (two) times daily.     methocarbamol 500 MG tablet  Commonly known as:  ROBAXIN  Take 1 tablet (500 mg total) by mouth every 6 (six) hours as needed for muscle spasms.     multivitamin with minerals Tabs tablet  Take 1 tablet by mouth daily.  OVER THE COUNTER MEDICATION  Take 1 tablet by mouth daily. [place]- Vitamin C immune health     oxyCODONE-acetaminophen 5-325 MG tablet  Commonly known as:  ROXICET  Take 1-2 tablets by mouth every 4 (four) hours as needed.        Diagnostic Studies: Dg C-arm 1-60 Min-no Report  06/14/2015  CLINICAL DATA: SURGERY C-ARM 1-60 MINUTES Fluoroscopy was utilized by the requesting physician.  No radiographic interpretation.   Dg Hip Port Unilat With Pelvis 1v Right  06/14/2015  CLINICAL DATA:  Status post right total hip replacement. EXAM: DG HIP (WITH OR WITHOUT PELVIS) 1V PORT RIGHT COMPARISON:  None. FINDINGS: The femoral and acetabular components appear to be well situated. No fracture dislocation is seen. Expected postoperative findings are seen in the soft tissues of the right hip. IMPRESSION: Status post right total hip arthroplasty. Electronically  Signed   By: Lupita RaiderJames  Green Jr, M.D.   On: 06/14/2015 16:59   Dg Hip Operative Unilat With Pelvis Right  06/14/2015  CLINICAL DATA:  Right total hip arthroplasty EXAM: OPERATIVE right HIP (WITH PELVIS IF PERFORMED) 2 VIEWS TECHNIQUE: Fluoroscopic spot image(s) were submitted for interpretation post-operatively. COMPARISON:  None. FINDINGS: Hardware components of a right total hip arthroplasty identified. The hardware components are in anatomic alignment. No complications identified. No periprosthetic fracture or dislocation. IMPRESSION: 1. Status post right total hip arthroplasty. Electronically Signed   By: Signa Kellaylor  Stroud M.D.   On: 06/14/2015 16:20    Disposition: 01-Home or Self Care      Discharge Instructions    Discharge patient    Complete by:  As directed            Follow-up Information    [facility]   Why:  Home Health Physical Therapy   Contact information:   [address] [place] [address] [phone]       Follow up with Kathryne HitchBLACKMAN,CHRISTOPHER Y, MD In 2 weeks.   Specialty:  Orthopedic Surgery   Contact information:   [address] [place] [address] [phone]        Signed: Kathryne HitchBLACKMAN,CHRISTOPHER Y 06/16/2015, 7:39 AM

## 2015-10-29 ENCOUNTER — Ambulatory Visit (HOSPITAL_COMMUNITY)
Admission: EM | Admit: 2015-10-29 | Discharge: 2015-10-29 | Disposition: A | Discharge status: Home / Self Care | Payer: PRIVATE HEALTH INSURANCE | Attending: Physician Assistant | Admitting: Physician Assistant

## 2015-10-29 ENCOUNTER — Encounter (HOSPITAL_COMMUNITY): Payer: Self-pay | Admitting: Emergency Medicine

## 2015-10-29 DIAGNOSIS — J9801 Acute bronchospasm: Secondary | ICD-10-CM

## 2015-10-29 MED ORDER — BENZONATATE 100 MG PO CAPS: 100.0000 mg | ORAL_CAPSULE | Freq: Three times a day (TID) | ORAL | 0 refills | Status: DC

## 2015-10-29 MED ORDER — AZITHROMYCIN 250 MG PO TABS: ORAL_TABLET | ORAL | 0 refills | Status: DC

## 2015-10-29 NOTE — ED Triage Notes (Signed)
The patient presented to the [facility] with a complaint of a cough and related chest wall paint that has been occurring for 2 weeks. The patient stated that the cough gets worse at night when he lays down.

## 2016-02-18 ENCOUNTER — Ambulatory Visit: Payer: PRIVATE HEALTH INSURANCE | Admitting: Neurology

## 2016-02-28 ENCOUNTER — Encounter (HOSPITAL_COMMUNITY): Payer: Self-pay | Admitting: Emergency Medicine

## 2016-02-28 DIAGNOSIS — Z79899 Other long term (current) drug therapy: Secondary | ICD-10-CM | POA: Insufficient documentation

## 2016-02-28 DIAGNOSIS — R600 Localized edema: Secondary | ICD-10-CM | POA: Insufficient documentation

## 2016-02-28 DIAGNOSIS — I10 Essential (primary) hypertension: Secondary | ICD-10-CM | POA: Insufficient documentation

## 2016-02-28 DIAGNOSIS — Z7982 Long term (current) use of aspirin: Secondary | ICD-10-CM | POA: Insufficient documentation

## 2016-02-28 DIAGNOSIS — Z96641 Presence of right artificial hip joint: Secondary | ICD-10-CM | POA: Insufficient documentation

## 2016-02-28 NOTE — ED Triage Notes (Signed)
Pt. reports right calf pain onset Tuesday with mild swelling , pt. suspects blood clot , ambulatory , denies SOB or fever, pedal pulses present .

## 2016-02-29 ENCOUNTER — Encounter (HOSPITAL_COMMUNITY): Payer: Self-pay | Admitting: *Deleted

## 2016-02-29 ENCOUNTER — Ambulatory Visit (HOSPITAL_BASED_OUTPATIENT_CLINIC_OR_DEPARTMENT_OTHER)
Admission: RE | Admit: 2016-02-29 | Discharge: 2016-02-29 | Disposition: A | Discharge status: Home / Self Care | Payer: PRIVATE HEALTH INSURANCE | Source: Ambulatory Visit | Attending: Emergency Medicine | Admitting: Emergency Medicine

## 2016-02-29 ENCOUNTER — Emergency Department (HOSPITAL_COMMUNITY)
Admission: EM | Admit: 2016-02-29 | Discharge: 2016-02-29 | Disposition: A | Discharge status: Home / Self Care | Payer: PRIVATE HEALTH INSURANCE | Attending: Emergency Medicine | Admitting: Emergency Medicine

## 2016-02-29 DIAGNOSIS — Z79899 Other long term (current) drug therapy: Secondary | ICD-10-CM | POA: Insufficient documentation

## 2016-02-29 DIAGNOSIS — I82401 Acute embolism and thrombosis of unspecified deep veins of right lower extremity: Secondary | ICD-10-CM | POA: Insufficient documentation

## 2016-02-29 DIAGNOSIS — R600 Localized edema: Secondary | ICD-10-CM

## 2016-02-29 DIAGNOSIS — Z7982 Long term (current) use of aspirin: Secondary | ICD-10-CM | POA: Insufficient documentation

## 2016-02-29 DIAGNOSIS — Z7901 Long term (current) use of anticoagulants: Secondary | ICD-10-CM | POA: Insufficient documentation

## 2016-02-29 DIAGNOSIS — M7989 Other specified soft tissue disorders: Secondary | ICD-10-CM

## 2016-02-29 DIAGNOSIS — M79609 Pain in unspecified limb: Secondary | ICD-10-CM | POA: Diagnosis not present

## 2016-02-29 DIAGNOSIS — I82431 Acute embolism and thrombosis of right popliteal vein: Secondary | ICD-10-CM | POA: Insufficient documentation

## 2016-02-29 DIAGNOSIS — M79604 Pain in right leg: Secondary | ICD-10-CM | POA: Diagnosis present

## 2016-02-29 DIAGNOSIS — Z96641 Presence of right artificial hip joint: Secondary | ICD-10-CM | POA: Diagnosis not present

## 2016-02-29 DIAGNOSIS — I10 Essential (primary) hypertension: Secondary | ICD-10-CM | POA: Insufficient documentation

## 2016-02-29 DIAGNOSIS — I82411 Acute embolism and thrombosis of right femoral vein: Secondary | ICD-10-CM

## 2016-02-29 LAB — I-STAT CHEM 8, ED
BUN: 15 mg/dL (ref 6–20)
CHLORIDE: 102 mmol/L (ref 101–111)
CREATININE: 1 mg/dL (ref 0.61–1.24)
Calcium, Ion: 1.24 mmol/L (ref 1.15–1.40)
GLUCOSE: 108 mg/dL — AB (ref 65–99)
HEMATOCRIT: 40 % (ref 39.0–52.0)
Hemoglobin: 13.6 g/dL (ref 13.0–17.0)
POTASSIUM: 3.7 mmol/L (ref 3.5–5.1)
Sodium: 141 mmol/L (ref 135–145)
TCO2: 26 mmol/L (ref 0–100)

## 2016-02-29 MED ORDER — RIVAROXABAN 15 MG PO TABS
15.0000 mg | ORAL_TABLET | Freq: Once | ORAL | Status: AC
  Administered 2016-02-29: 15 mg via ORAL
  Filled 2016-02-29: qty 1

## 2016-02-29 MED ORDER — RIVAROXABAN (XARELTO) EDUCATION KIT FOR DVT/PE PATIENTS
PACK | Freq: Once | Status: AC
  Administered 2016-02-29: 12:00:00
  Filled 2016-02-29: qty 1

## 2016-02-29 MED ORDER — ENOXAPARIN SODIUM 80 MG/0.8ML ~~LOC~~ SOLN
1.0000 mg/kg | Freq: Once | SUBCUTANEOUS | Status: AC
  Administered 2016-02-29: 80 mg via SUBCUTANEOUS
  Filled 2016-02-29: qty 0.8

## 2016-02-29 MED ORDER — RIVAROXABAN (XARELTO) VTE STARTER PACK (15 & 20 MG): ORAL_TABLET | ORAL | 0 refills | Status: DC

## 2016-02-29 NOTE — ED Provider Notes (Signed)
[facility] DEPT Provider Note   CSN: [id number] Arrival date & time: 02/29/16  0957     History   Chief Complaint Chief Complaint  Patient presents with  . Leg Pain  . DVT    HPI Eric Johnston is a 45 y.o. male.   He presents for evaluation of known venous thrombosis right leg. Seen yesterday, swelling, right leg, treated with Lovenox and referred for venous Doppler today. He was diagnosed with nonocclusive DVT right popliteal vein, and occlusive DVT, right peroneal veins, both acute. He denies nausea, vomiting, chest pain, shortness of breath, weakness or dizziness at this time. No prior history of venous thromboembolism. He remains fairly active, both exercising, and as a [employer] administrator. No prolonged periods of immobilization, or long trips. There are no other known modifying factors.   HPI  Past Medical History:  Diagnosis Date  . Avascular necrosis ([place])    RT HIP  . Hypertension    WAS TAKEN OFF BP MEDS BY PCP    Patient Active Problem List   Diagnosis Date Noted  . Avascular necrosis of bone of right hip ([place]) 06/14/2015  . Status post total replacement of right hip 06/14/2015    Past Surgical History:  Procedure Laterality Date  . PERIDONTAL SURGERY    . TOTAL HIP ARTHROPLASTY Right 06/14/2015   Procedure: RIGHT TOTAL HIP ARTHROPLASTY ANTERIOR APPROACH;  Surgeon: Mcarthur Rossetti, MD;  Location: [facility] ORS;  Service: Orthopedics;  Laterality: Right;       Home Medications    Prior to Admission medications   Medication Sig Start Date End Date Taking? Authorizing Provider  aspirin EC 325 MG EC tablet Take 1 tablet (325 mg total) by mouth 2 (two) times daily after a meal. 06/16/15   Mcarthur Rossetti, MD  azithromycin (ZITHROMAX) 250 MG tablet Take first 2 tablets together, then 1 every day until finished. 10/29/15   Konrad Felix, PA  benzonatate (TESSALON) 100 MG capsule Take 1 capsule (100 mg total) by mouth every 8 (eight) hours. 10/29/15    Konrad Felix, PA  COD LIVER OIL PO Take 1 capsule by mouth daily.    Historical Provider, MD  meloxicam (MOBIC) 7.5 MG tablet Take 2 tablets (15 mg total) by mouth daily. Patient not taking: Reported on 06/03/2015 04/12/13   Antonietta Breach, PA-C  methocarbamol (ROBAXIN) 500 MG tablet Take 1 tablet (500 mg total) by mouth 2 (two) times daily. Patient not taking: Reported on 06/03/2015 04/12/13   Antonietta Breach, PA-C  methocarbamol (ROBAXIN) 500 MG tablet Take 1 tablet (500 mg total) by mouth every 6 (six) hours as needed for muscle spasms. 06/16/15   Mcarthur Rossetti, MD  Multiple Vitamin (MULTIVITAMIN WITH MINERALS) TABS tablet Take 1 tablet by mouth daily.    Historical Provider, MD  OVER THE COUNTER MEDICATION Take 1 tablet by mouth daily. [place]- Vitamin C immune health    Historical Provider, MD  oxyCODONE-acetaminophen (ROXICET) 5-325 MG tablet Take 1-2 tablets by mouth every 4 (four) hours as needed. 06/16/15   Mcarthur Rossetti, MD  Rivaroxaban 15 & 20 MG TBPK Take as directed on package: Start with one 97m tablet by mouth twice a day with food. On Day 22, switch to one 231mtablet once a day with food. 02/29/16   ElDaleen BoMD    Family History History reviewed. No pertinent family history.  Social History Social History  Substance Use Topics  . Smoking status: Never Smoker  . Smokeless  tobacco: Never Used  . Alcohol use No     Allergies   Patient has no known allergies.   Review of Systems Review of Systems  All other systems reviewed and are negative.    Physical Exam Updated Vital Signs BP 145/88 (BP Location: Right Arm)   Pulse 78   Resp 18   SpO2 100%   Physical Exam  Constitutional: He is oriented to person, place, and time. He appears well-developed and well-nourished.  HENT:  Head: Normocephalic and atraumatic.  Right Ear: External ear normal.  Left Ear: External ear normal.  Eyes: Conjunctivae and EOM are normal. Pupils are equal, round, and  reactive to light.  Neck: Normal range of motion and phonation normal. Neck supple.  Cardiovascular: Normal rate, normal heart sounds and intact distal pulses.   Pulmonary/Chest: Effort normal. He exhibits no bony tenderness.  Musculoskeletal: Normal range of motion.  Right lower leg swelling, with tenderness, upper calf and lateral mid lower leg. Neurovascular intact distally, in the right foot.  Neurological: He is alert and oriented to person, place, and time. No cranial nerve deficit or sensory deficit. He exhibits normal muscle tone. Coordination normal.  Skin: Skin is warm, dry and intact.  Psychiatric: He has a normal mood and affect. His behavior is normal. Judgment and thought content normal.  Nursing note and vitals reviewed.    ED Treatments / Results  Labs (all labs ordered are listed, but only abnormal results are displayed) Labs Reviewed - No data to display  EKG  EKG Interpretation None       Radiology No results found.  Procedures Procedures (including critical care time)  Medications Ordered in ED Medications  Rivaroxaban (XARELTO) tablet 15 mg (15 mg Oral Given 02/29/16 1120)  rivaroxaban (XARELTO) Education Kit for DVT/PE patients ( Does not apply Given 02/29/16 1144)     Initial Impression / Assessment and Plan / ED Course  I have reviewed the triage vital signs and the nursing notes.  Pertinent labs & imaging results that were available during my care of the patient were reviewed by me and considered in my medical decision making (see chart for details).  Clinical Course     Medications  Rivaroxaban (XARELTO) tablet 15 mg (15 mg Oral Given 02/29/16 1120)  rivaroxaban (XARELTO) Education Kit for DVT/PE patients ( Does not apply Given 02/29/16 1144)    Patient Vitals for the past 24 hrs:  BP Pulse Resp SpO2  02/29/16 1005 145/88 78 18 100 %       Final Clinical Impressions(s) / ED Diagnoses   Final diagnoses:  Acute thromboembolism of  deep veins of right lower extremity ([place])    Uncomplicated right DVT, without known precipitating cause. Hemodynamically stable with normal renal function.  Nursing Notes Reviewed/ Care Coordinated Applicable Imaging Reviewed Interpretation of Laboratory Data incorporated into ED treatment  The patient appears reasonably screened and/or stabilized for discharge and I doubt any other medical condition or other [facility] requiring further screening, evaluation, or treatment in the ED at this time prior to discharge.  Plan: Home Medications- continue; Home Treatments- rest, elevation; return here if the recommended treatment, does not improve the symptoms; Recommended follow up- PCP 1 week    New Prescriptions Discharge Medication List as of 02/29/2016 11:45 AM    START taking these medications   Details  Rivaroxaban 15 & 20 MG TBPK Take as directed on package: Start with one 105m tablet by mouth twice a day with food. On Day  22, switch to one 49m tablet once a day with food., Print         EDaleen Bo MD 03/01/16 [phone]

## 2016-02-29 NOTE — Discharge Instructions (Signed)
° °  Elevate the right leg above your heart when possible.  It is okay to walk and do gentle exercise.  See your PCP for checkup in one week. Ask him to check you for causes of clotting.  Information on my medicine - XARELTO (rivaroxaban)  This medication education was reviewed with me or my healthcare representative as part of my discharge preparation.   WHY WAS XARELTO PRESCRIBED FOR YOU? Xarelto was prescribed to treat blood clots that may have been found in the veins of your legs (deep vein thrombosis) or in your lungs (pulmonary embolism) and to reduce the risk of them occurring again.  What do you need to know about Xarelto? The starting dose is one 15 mg tablet taken TWICE daily with food for the FIRST 21 DAYS then on 12/10  the dose is changed to one 20 mg tablet taken ONCE A DAY with your evening meal.  DO NOT stop taking Xarelto without talking to the health care provider who prescribed the medication.  Refill your prescription for 20 mg tablets before you run out.  After discharge, you should have regular check-up appointments with your healthcare provider that is prescribing your Xarelto.  In the future your dose may need to be changed if your kidney function changes by a significant amount.  What do you do if you miss a dose? If you are taking Xarelto TWICE DAILY and you miss a dose, take it as soon as you remember. You may take two 15 mg tablets (total 30 mg) at the same time then resume your regularly scheduled 15 mg twice daily the next day.  If you are taking Xarelto ONCE DAILY and you miss a dose, take it as soon as you remember on the same day then continue your regularly scheduled once daily regimen the next day. Do not take two doses of Xarelto at the same time.   Important Safety Information Xarelto is a blood thinner medicine that can cause bleeding. You should call your healthcare provider right away if you experience any of the following: ? Bleeding from an  injury or your nose that does not stop. ? Unusual colored urine (red or dark brown) or unusual colored stools (red or black). ? Unusual bruising for unknown reasons. ? A serious fall or if you hit your head (even if there is no bleeding).  Some medicines may interact with Xarelto and might increase your risk of bleeding while on Xarelto. To help avoid this, consult your healthcare provider or pharmacist prior to using any new prescription or non-prescription medications, including herbals, vitamins, non-steroidal anti-inflammatory drugs (NSAIDs) and supplements.  This website has more information on Xarelto: VisitDestination.com.brwww.xarelto.com.

## 2016-02-29 NOTE — ED Notes (Signed)
Declined W/C at D/C and was escorted to lobby by RN. 

## 2016-02-29 NOTE — ED Notes (Signed)
Ice chips & orange juice given to pt per Alecia RN.  

## 2016-02-29 NOTE — ED Triage Notes (Signed)
Pt was seen here yesterday for right lower leg pain and swelling. Had outpatient vascular study this am and was + for blood clot.

## 2016-02-29 NOTE — Progress Notes (Addendum)
VASCULAR LAB PRELIMINARY  PRELIMINARY  PRELIMINARY  PRELIMINARY  Right lower extremity venous duplex completed.    Preliminary report:  There is acute, non occlusive DVT noted in the right popliteal vein and acute occlusive DVT noted in the right peroneal veins.  Gave report to [place], RN  Gagandeep Kossman, RVT 02/29/2016, 9:49 AM

## 2016-02-29 NOTE — ED Provider Notes (Signed)
[facility]-EMERGENCY DEPT Provider Note   CSN: [id number] Arrival date & time: 02/28/16  2027  By signing my name below, I, [name]. [name], attest that this documentation has been prepared under the direction and in the presence of Eric Rhine, MD.  Electronically Signed: [name]. [name], ED Scribe. 02/29/16. 12:44 AM.    History   Chief Complaint Chief Complaint  Patient presents with  . Leg Pain   The history is provided by the patient. No language interpreter was used.  Leg Pain   This is a new problem. The current episode started more than 2 days ago. The problem occurs constantly. The problem has not changed since onset.The pain is present in the right lower leg. The pain is moderate. Pertinent negatives include no numbness. He has tried nothing for the symptoms. There has been no history of extremity trauma.    HPI Comments: Eric Johnston is a 45 y.o. male with a PMHx of HTN who presents to the Emergency Department complaining of constant, unchanged R calf pain x 3 days. Pt states at time of onset of discomfort, calf was "tight", warm, and swollen. However, he states tightness has loosened up some. Discomfort to leg is exacerbated with touch and deep palpation. No alleviating factors at this time. No OTC medications or home remedies attempted prior to arrival. No recent fever, chills, nausea, vomiting, abdominal pain, chest pain, or shortness of breath. No prior history of bleeding issues. No blood noted in stools. He is not currently on any anticoagulants. PSHx includes R total hip arthroscopy 06/14/2015.  PCP: Eric Hoff, MD    Past Medical History:  Diagnosis Date  . Avascular necrosis (HCC)    RT HIP  . Hypertension    WAS TAKEN OFF BP MEDS BY PCP    Patient Active Problem List   Diagnosis Date Noted  . Avascular necrosis of bone of right hip (HCC) 06/14/2015  . Status post total replacement of right hip 06/14/2015    Past Surgical History:  Procedure  Laterality Date  . PERIDONTAL SURGERY    . TOTAL HIP ARTHROPLASTY Right 06/14/2015   Procedure: RIGHT TOTAL HIP ARTHROPLASTY ANTERIOR APPROACH;  Surgeon: [name], MD;  Location: [facility] ORS;  Service: Orthopedics;  Laterality: Right;       Home Medications    Prior to Admission medications   Medication Sig Start Date End Date Taking? Authorizing Provider  aspirin EC 325 MG EC tablet Take 1 tablet (325 mg total) by mouth 2 (two) times daily after a meal. 06/16/15   [name], MD  azithromycin (ZITHROMAX) 250 MG tablet Take first 2 tablets together, then 1 every day until finished. 10/29/15   [name], PA  benzonatate (TESSALON) 100 MG capsule Take 1 capsule (100 mg total) by mouth every 8 (eight) hours. 10/29/15   [name], PA  COD LIVER OIL PO Take 1 capsule by mouth daily.    Historical Provider, MD  meloxicam (MOBIC) 7.5 MG tablet Take 2 tablets (15 mg total) by mouth daily. Patient not taking: Reported on 06/03/2015 04/12/13   [name], PA-C  methocarbamol (ROBAXIN) 500 MG tablet Take 1 tablet (500 mg total) by mouth 2 (two) times daily. Patient not taking: Reported on 06/03/2015 04/12/13   [name], PA-C  methocarbamol (ROBAXIN) 500 MG tablet Take 1 tablet (500 mg total) by mouth every 6 (six) hours as needed for muscle spasms. 06/16/15   [name], MD  Multiple Vitamin (MULTIVITAMIN  WITH MINERALS) TABS tablet Take 1 tablet by mouth daily.    Historical Provider, MD  OVER THE COUNTER MEDICATION Take 1 tablet by mouth daily. [place]- Vitamin C immune health    Historical Provider, MD  oxyCODONE-acetaminophen (ROXICET) 5-325 MG tablet Take 1-2 tablets by mouth every 4 (four) hours as needed. 06/16/15   Kathryne Hitchhristopher Y Blackman, MD    Family History No family history on file.  Social History Social History  Substance Use Topics  . Smoking status: Never Smoker  . Smokeless tobacco: Never Used  . Alcohol use No     Allergies   Patient  has no known allergies.   Review of Systems Review of Systems  Constitutional: Negative for chills and fever.  Respiratory: Negative for shortness of breath.   Cardiovascular: Positive for leg swelling. Negative for chest pain.  Gastrointestinal: Negative for nausea and vomiting.  Musculoskeletal: Positive for arthralgias.  Neurological: Negative for weakness and numbness.  Psychiatric/Behavioral: Negative for confusion.  All other systems reviewed and are negative.    Physical Exam Updated Vital Signs BP 168/86   Pulse 91   Temp 98.7 F (37.1 C) (Oral)   Resp 18   Ht 5\' 8"  (1.727 m)   Wt 174 lb (78.9 kg)   SpO2 100%   BMI 26.46 kg/m   Physical Exam  CONSTITUTIONAL: Well developed/well nourished HEAD: Normocephalic/atraumatic EYES: EOMI/PERRL ENMT: Mucous membranes moist NECK: supple no meningeal signs SPINE/BACK:entire spine nontender CV: S1/S2 noted, no murmurs/rubs/gallops noted LUNGS: Lungs are clear to auscultation bilaterally, no apparent distress ABDOMEN: soft, nontender, no rebound or guarding, bowel sounds noted throughout abdomen GU:no cva tenderness NEURO: Pt is awake/alert/appropriate, moves all extremitiesx4.  No facial droop.   EXTREMITIES: pulses normal/equal, full ROM. R calf tenderness, R lower extremity edema. Distal pulses intact. SKIN: warm, color normal PSYCH: no abnormalities of mood noted, alert and oriented to situation   ED Treatments / Results   DIAGNOSTIC STUDIES: Oxygen Saturation is 100% on RA, Normal by my interpretation.    COORDINATION OF CARE: 12:43 AM- Will give Lovenox injection. Will order imaging and I-stat 8.Discussed treatment plan with pt at bedside and pt agreed to plan.     Labs (all labs ordered are listed, but only abnormal results are displayed) Labs Reviewed  I-STAT CHEM 8, ED - Abnormal; Notable for the following:       Result Value   Glucose, Bld 108 (*)    All other components within normal limits    EKG   EKG Interpretation None       Radiology No results found.  Procedures Procedures (including critical care time)  Medications Ordered in ED Medications  enoxaparin (LOVENOX) injection 80 mg (80 mg Subcutaneous Given 02/29/16 0129)     Initial Impression / Assessment and Plan / ED Course  I have reviewed the triage vital signs and the nursing notes.  Pertinent labs  results that were available during my care of the patient were reviewed by me and considered in my medical decision making (see chart for details).  Clinical Course     Plan to return later today for US of right LE to evaluate for DVT Pt agreeable with plan BP 120/78   Pulse 60   Temp 98 F (36.7 C) (Oral)   Resp 16   Ht 5\' 8"  (1.727 m)   Wt 78.9 kg   SpO2 97%   BMI 26.46 kg/m    Final Clinical Impressions(s) / ED Diagnoses   Final diagnoses:  Lower extremity edema    New Prescriptions New Prescriptions   No medications on file  I personally performed the services described in this documentation, which was scribed in my presence. The recorded information has been reviewed and is accurate.        Eric Rhineonald Donyae Kohn, MD 02/29/16 [phone]

## 2016-02-29 NOTE — Discharge Instructions (Signed)
PLEASE RETURN AT 8AM TO THE ER ENTRANCE FOR YOUR ULTRASOUND OF YOUR LEG

## 2016-03-01 ENCOUNTER — Encounter (HOSPITAL_COMMUNITY): Payer: Self-pay

## 2016-03-01 ENCOUNTER — Emergency Department (HOSPITAL_COMMUNITY)
Admission: EM | Admit: 2016-03-01 | Discharge: 2016-03-01 | Disposition: A | Discharge status: Home / Self Care | Payer: PRIVATE HEALTH INSURANCE | Attending: Emergency Medicine | Admitting: Emergency Medicine

## 2016-03-01 DIAGNOSIS — I10 Essential (primary) hypertension: Secondary | ICD-10-CM | POA: Diagnosis not present

## 2016-03-01 DIAGNOSIS — Z96641 Presence of right artificial hip joint: Secondary | ICD-10-CM | POA: Diagnosis not present

## 2016-03-01 DIAGNOSIS — Z7901 Long term (current) use of anticoagulants: Secondary | ICD-10-CM | POA: Insufficient documentation

## 2016-03-01 DIAGNOSIS — Z7982 Long term (current) use of aspirin: Secondary | ICD-10-CM | POA: Insufficient documentation

## 2016-03-01 DIAGNOSIS — I82401 Acute embolism and thrombosis of unspecified deep veins of right lower extremity: Secondary | ICD-10-CM | POA: Insufficient documentation

## 2016-03-01 DIAGNOSIS — M79604 Pain in right leg: Secondary | ICD-10-CM | POA: Diagnosis present

## 2016-03-01 DIAGNOSIS — I824Y1 Acute embolism and thrombosis of unspecified deep veins of right proximal lower extremity: Secondary | ICD-10-CM

## 2016-03-01 HISTORY — DX: Acute embolism and thrombosis of unspecified deep veins of unspecified lower extremity: I82.409

## 2016-03-01 MED ORDER — RIVAROXABAN 15 MG PO TABS
15.0000 mg | ORAL_TABLET | Freq: Once | ORAL | Status: DC
  Filled 2016-03-01: qty 1

## 2016-03-01 MED ORDER — ENOXAPARIN SODIUM 120 MG/0.8ML ~~LOC~~ SOLN
1.5000 mg/kg | Freq: Once | SUBCUTANEOUS | Status: AC
  Administered 2016-03-01: 120 mg via SUBCUTANEOUS
  Filled 2016-03-01: qty 0.8

## 2016-03-01 MED ORDER — OXYCODONE-ACETAMINOPHEN 5-325 MG PO TABS: 1.0000 | ORAL_TABLET | ORAL | 0 refills | Status: DC | PRN

## 2016-03-01 MED ORDER — OXYCODONE-ACETAMINOPHEN 5-325 MG PO TABS
2.0000 | ORAL_TABLET | Freq: Once | ORAL | Status: AC
  Administered 2016-03-01: 2 via ORAL
  Filled 2016-03-01: qty 2

## 2016-03-01 NOTE — Discharge Instructions (Signed)
Elevate your leg AT ALL TIMES, above the level of your heart, to improve swelling  Wear high socks to help swelling  Follow-up with your doctor  Start your Xarelto tomorrow

## 2016-03-01 NOTE — ED Provider Notes (Signed)
[facility]-EMER[facility]ENCY DEPT Provider Note   CSN: [id number] Arrival date & time: 03/01/16  1633     History   Chief Complaint Chief Complaint  Patient presents with  . Leg Pain    HPI Eric Johnston is a 45 y.o. male.  HPI 45 year old male with history of avascular necrosis of the hip as well as recently diagnosed right lower extremity DVT who presents with worsening right lower extremity pain and swelling. As mentioned, patient was just seen yesterday at which time ultrasound showed distal femoral DVT. He was given a dose of several toe and subsequently sent home. He states that he was unable to fill his relative. Over the last several hours, he has had worsening of his pain. He endorses intermittent tingling sensation throughout his entire foot but denies any persistent numbness or weakness. No fevers or chills. Denies any chest pain, shortness of breath or other complaints. He states he has been elevating his foot, although not regularly. Denies any history of blood clots.  Past Medical History:  Diagnosis Date  . Avascular necrosis (HCC)    RT HIP  . DVT (deep venous thrombosis) (HCC)   . Hypertension    WAS TAKEN OFF BP MEDS BY PCP    Patient Active Problem List   Diagnosis Date Noted  . Avascular necrosis of bone of right hip (HCC) 06/14/2015  . Status post total replacement of right hip 06/14/2015    Past Surgical History:  Procedure Laterality Date  . PERIDONTAL SURGERY    . TOTAL HIP ARTHROPLASTY Right 06/14/2015   Procedure: RIGHT TOTAL HIP ARTHROPLASTY ANTERIOR APPROACH;  Surgeon: Kathryne Hitchhristopher Y Blackman, MD;  Location: [facility];  Service: Orthopedics;  Laterality: Right;       Home Medications    Prior to Admission medications   Medication Sig Start Date End Date Taking? Authorizing Provider  aspirin EC 325 MG EC tablet Take 1 tablet (325 mg total) by mouth 2 (two) times daily after a meal. 06/16/15   Kathryne Hitchhristopher Y Blackman, MD  azithromycin (ZITHROMAX) 250 MG  tablet Take first 2 tablets together, then 1 every day until finished. 10/29/15   Tharon AquasFrank C Patrick, PA  benzonatate (TESSALON) 100 MG capsule Take 1 capsule (100 mg total) by mouth every 8 (eight) hours. 10/29/15   Tharon AquasFrank C Patrick, PA  COD LIVER OIL PO Take 1 capsule by mouth daily.    Historical Provider, MD  meloxicam (MOBIC) 7.5 MG tablet Take 2 tablets (15 mg total) by mouth daily. Patient not taking: Reported on 06/03/2015 04/12/13   Antony MaduraKelly Humes, PA-C  methocarbamol (ROBAXIN) 500 MG tablet Take 1 tablet (500 mg total) by mouth 2 (two) times daily. Patient not taking: Reported on 06/03/2015 04/12/13   Antony MaduraKelly Humes, PA-C  methocarbamol (ROBAXIN) 500 MG tablet Take 1 tablet (500 mg total) by mouth every 6 (six) hours as needed for muscle spasms. 06/16/15   Kathryne Hitchhristopher Y Blackman, MD  Multiple Vitamin (MULTIVITAMIN WITH MINERALS) TABS tablet Take 1 tablet by mouth daily.    Historical Provider, MD  OVER THE COUNTER MEDICATION Take 1 tablet by mouth daily. [place]- Vitamin C immune health    Historical Provider, MD  oxyCODONE-acetaminophen (PERCOCET/ROXICET) 5-325 MG tablet Take 1 tablet by mouth every 4 (four) hours as needed for severe pain. 03/01/16   Shaune Pollackameron Monik Lins, MD  Rivaroxaban 15 & 20 MG TBPK Take as directed on package: Start with one 15mg  tablet by mouth twice a day with food. On Day 22, switch to one 20mg   tablet once a day with food. 02/29/16   Mancel BaleElliott Wentz, MD    Family History No family history on file.  Social History Social History  Substance Use Topics  . Smoking status: Never Smoker  . Smokeless tobacco: Never Used  . Alcohol use No     Allergies   Patient has no known allergies.   Review of Systems Review of Systems  Constitutional: Negative for chills, fatigue and fever.  HENT: Negative for congestion and rhinorrhea.   Eyes: Negative for visual disturbance.  Respiratory: Negative for cough, shortness of breath and wheezing.   Cardiovascular: Positive for leg swelling.  Negative for chest pain.  Gastrointestinal: Negative for abdominal pain, diarrhea, nausea and vomiting.  Genitourinary: Negative for dysuria and flank pain.  Musculoskeletal: Positive for gait problem (due to pain). Negative for neck pain and neck stiffness.  Skin: Negative for rash and wound.  Allergic/Immunologic: Negative for immunocompromised state.  Neurological: Negative for syncope, weakness and headaches.  All other systems reviewed and are negative.    Physical Exam Updated Vital Signs BP 136/96 (BP Location: Left Arm)   Pulse 81   Temp 100.3 F (37.9 C) (Oral)   Resp 16   SpO2 100%   Physical Exam  Constitutional: He is oriented to person, place, and time. He appears well-developed and well-nourished. No distress.  HENT:  Head: Normocephalic and atraumatic.  Mouth/Throat: No oropharyngeal exudate.  Eyes: Conjunctivae are normal.  Neck: Neck supple.  Cardiovascular: Normal rate, regular rhythm and normal heart sounds.  Exam reveals no friction rub.   No murmur heard. Pulmonary/Chest: Effort normal and breath sounds normal. No respiratory distress. He has no wheezes. He has no rales.  Abdominal: He exhibits no distension.  Musculoskeletal: He exhibits edema.  Neurological: He is alert and oriented to person, place, and time. He exhibits normal muscle tone.  Skin: Skin is warm. Capillary refill takes less than 2 seconds.  Psychiatric: He has a normal mood and affect.  Vitals reviewed.   LOWER EXTREMITY EXAM: RIGHT  INSPECTION & PALPATION: Moderate edema of right leg below knee with pain upon palpation of lower leg, worse in calf. Compartments are swollen but soft. No redness or erythema. No pallor. No duskiness.  SENSORY: sensation is intact to light touch in:  Superficial peroneal nerve distribution (over dorsum of foot) Deep peroneal nerve distribution (over first dorsal web space) Sural nerve distribution (over lateral aspect 5th metatarsal) Saphenous nerve  distribution (over medial instep)  MOTOR:  + Motor EHL (great toe dorsiflexion) + FHL (great toe plantar flexion)  + TA (ankle dorsiflexion)  + GSC (ankle plantar flexion)  VASCULAR: 2+ dorsalis pedis and posterior tibialis pulses Capillary refill < 2 sec, toes warm and well-perfused  COMPARTMENTS: Soft, warm, well-perfused No pain with passive extension No parethesias    ED Treatments / Results  Labs (all labs ordered are listed, but only abnormal results are displayed) Labs Reviewed - No data to display  EKG  EKG Interpretation None       Radiology No results found.  Procedures Procedures (including critical care time)  Medications Ordered in ED Medications  oxyCODONE-acetaminophen (PERCOCET/ROXICET) 5-325 MG per tablet 2 tablet (2 tablets Oral Given 03/01/16 1751)  enoxaparin (LOVENOX) injection 120 mg (120 mg Subcutaneous Given 03/01/16 1751)    EMERGENCY DEPARTMENT US SOFT TISSUE/VENOUS INTERPRETATION "Study: Limited Ultrasound of the noted body part in comments below"  INDICATIONS: Lower extremity pain and swelling, recent diagnosis of DVT Multiple views of the body part  are obtained with a multi-frequency linear probe  PERFORMED BY:  Myself  IMAGES ARCHIVED?: Yes  SIDE:Right   BODY PART: Leg/lower extremity  FINDINGS: The CFV, SFV, and PFV were visualized and demonstrated normal compressibility, good venous flow, and no evidence of DVT.  LIMITATIONS:  Body habitus  INTERPRETATION:  No evidence of proximal DVT on limited ultrasound    Initial Impression / Assessment and Plan / ED Course  I have reviewed the triage vital signs and the nursing notes.  Pertinent labs & imaging results that were available during my care of the patient were reviewed by me and considered in my medical decision making (see chart for details).  Clinical Course     45 year old male with past medical history of recent diagnosis of distal femoral DVT who presents  with worsening leg pain and swelling. On arrival, vital signs are stable. He strongly denies any chest pain, tachycardia, shortness of breath, hemoptysis, or evidence of PE. On exam of the extremity, he has significant swelling but no distal neurological compromise. He reports intermittent paresthesias but these are not in the distribution of a peripheral nerve and are likely secondary to swelling. His cap refill is fully intact, he has bounding DP and PT pulses, and I see no evidence of compartment syndrome. He has no pain with passive extension. Ultrasound performed by myself of the common femoral and superficial femoral veins shows no evidence of obstruction with good flow. Given no evidence of proximal clot on recent ultrasound and bedside ultrasound today, stable vital signs, and no evidence of compartment syndrome, believe Patient can continue to be managed as an outpatient. Of note, patient received a Xarelto dose at approximately noon yesterday, but was unable to fill his prescription. This may also explain some of his worsening symptoms. I have given him a 24-hour dose of Lovenox here and he confirms that the pharmacy will have his medication ready at 5 PM tomorrow.  Final Clinical Impressions(s) / ED Diagnoses   Final diagnoses:  Acute deep vein thrombosis (DVT) of proximal vein of right lower extremity [facility])    New Prescriptions Discharge Medication List as of 03/01/2016  6:25 PM       Shaune Pollack, MD 03/01/16 1950

## 2016-03-01 NOTE — ED Triage Notes (Signed)
Patient diagnosed with DVT right lower leg yesterday and was prescribed xarelto. States increased pain and here for the discomfort. States xarelto will not be ready until tomorrow for pick-up. NAD

## 2016-05-28 ENCOUNTER — Encounter: Payer: Self-pay | Admitting: Hematology

## 2016-05-28 ENCOUNTER — Telehealth: Payer: Self-pay | Admitting: Hematology

## 2016-05-28 NOTE — Telephone Encounter (Signed)
Tc to the pt to schedule a hem appt. Pt has been scheduled to see Dr. Candise CheKale on 4/12 at 11am. Agreed to the appt date and time. Demographics verified. Letter mailed to the pt and faxed to the referring.

## 2016-06-25 ENCOUNTER — Ambulatory Visit (INDEPENDENT_AMBULATORY_CARE_PROVIDER_SITE_OTHER): Payer: PRIVATE HEALTH INSURANCE | Admitting: Physician Assistant

## 2016-07-06 IMAGING — DX DG HIP (WITH OR WITHOUT PELVIS) 1V PORT*R*
2 series · 2 of 2 positions shown · non-contrast
Comparison: None.

CLINICAL DATA: Status post right total hip replacement.

EXAM:
DG HIP (WITH OR WITHOUT PELVIS) 1V PORT RIGHT

[pelvis ap]
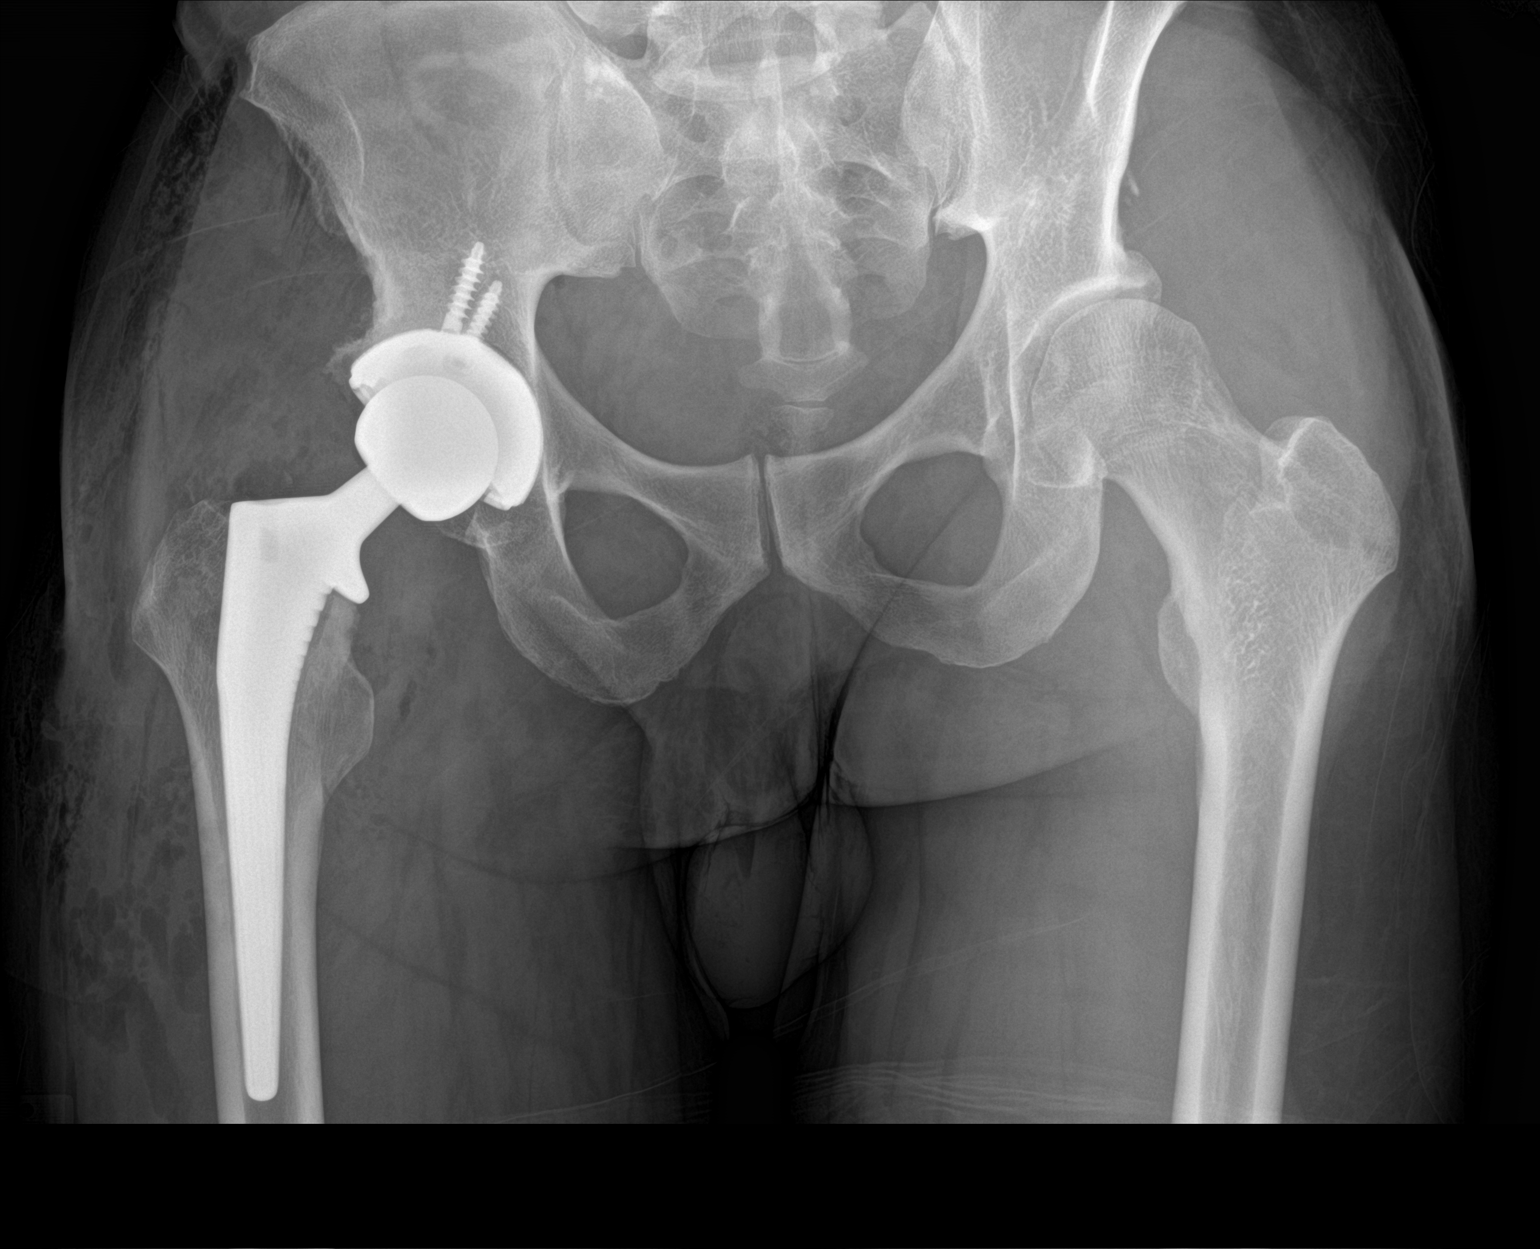

[hip lat]
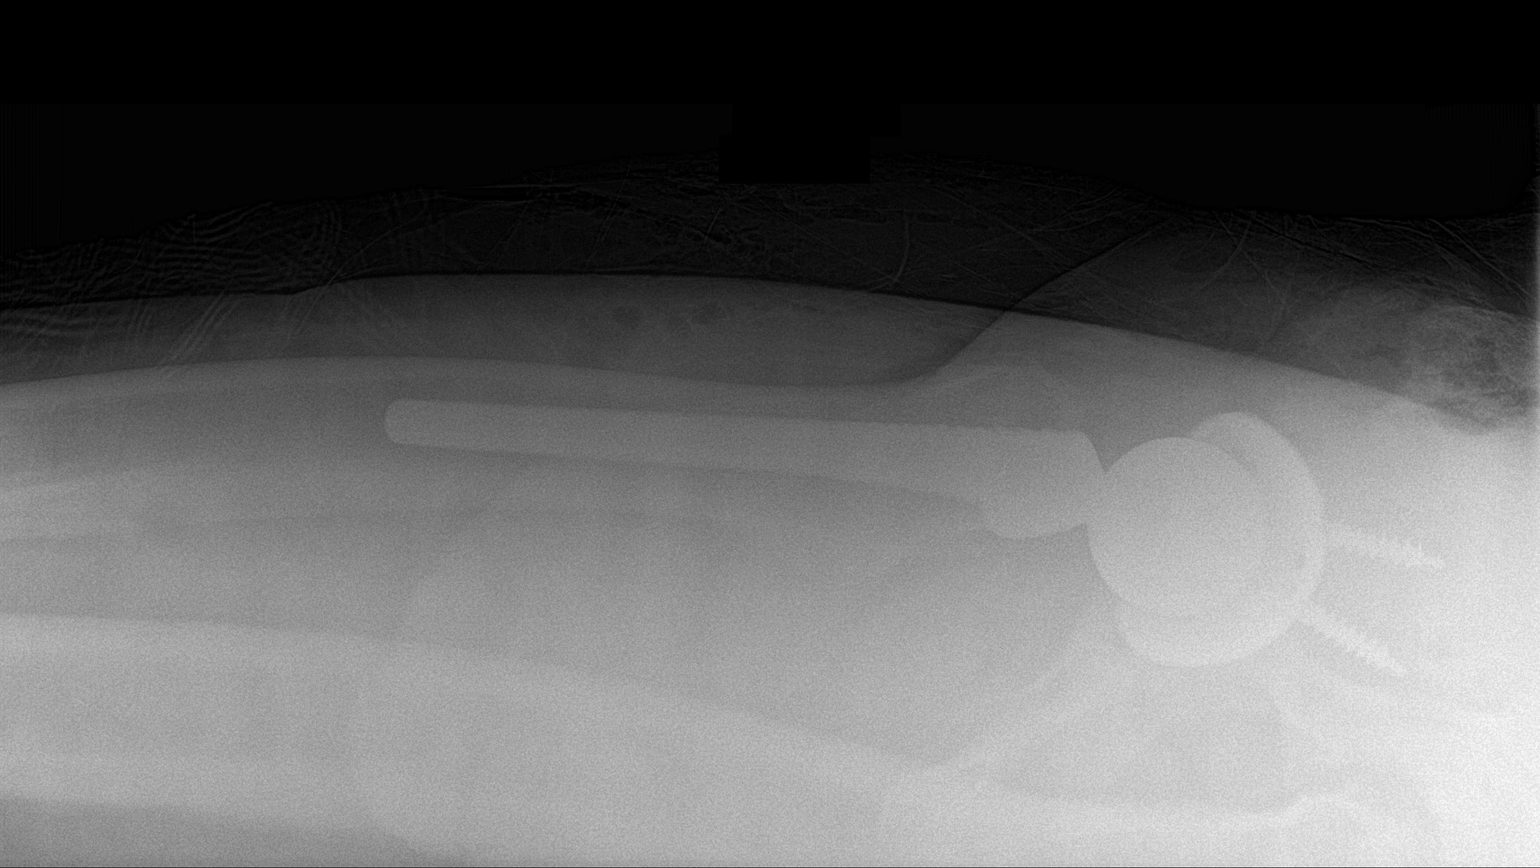

[2 of 2 positions shown; findings below may reference images not displayed]

FINDINGS: The femoral and acetabular components appear to be well situated. No
fracture dislocation is seen. Expected postoperative findings are
seen in the soft tissues of the right hip.
IMPRESSION: Status post right total hip arthroplasty.

## 2016-07-16 ENCOUNTER — Encounter: Payer: PRIVATE HEALTH INSURANCE | Admitting: Hematology

## 2017-05-03 ENCOUNTER — Ambulatory Visit (INDEPENDENT_AMBULATORY_CARE_PROVIDER_SITE_OTHER): Payer: PRIVATE HEALTH INSURANCE | Admitting: Orthopaedic Surgery

## 2017-05-06 ENCOUNTER — Ambulatory Visit (INDEPENDENT_AMBULATORY_CARE_PROVIDER_SITE_OTHER): Payer: PRIVATE HEALTH INSURANCE | Admitting: Orthopaedic Surgery

## 2017-05-27 ENCOUNTER — Ambulatory Visit (INDEPENDENT_AMBULATORY_CARE_PROVIDER_SITE_OTHER): Payer: PRIVATE HEALTH INSURANCE | Admitting: Orthopaedic Surgery

## 2017-06-03 ENCOUNTER — Ambulatory Visit (INDEPENDENT_AMBULATORY_CARE_PROVIDER_SITE_OTHER): Payer: PRIVATE HEALTH INSURANCE | Admitting: Orthopaedic Surgery

## 2017-06-14 ENCOUNTER — Encounter (INDEPENDENT_AMBULATORY_CARE_PROVIDER_SITE_OTHER): Payer: Self-pay | Admitting: Orthopaedic Surgery

## 2017-06-14 ENCOUNTER — Ambulatory Visit (INDEPENDENT_AMBULATORY_CARE_PROVIDER_SITE_OTHER): Payer: PRIVATE HEALTH INSURANCE | Admitting: Orthopaedic Surgery

## 2017-06-14 ENCOUNTER — Ambulatory Visit (INDEPENDENT_AMBULATORY_CARE_PROVIDER_SITE_OTHER): Payer: PRIVATE HEALTH INSURANCE

## 2017-06-14 DIAGNOSIS — M25552 Pain in left hip: Secondary | ICD-10-CM | POA: Diagnosis not present

## 2017-06-14 NOTE — Progress Notes (Signed)
Office Visit Note   Patient: Eric Johnston           Date of Birth: 06-Aug-1970           MRN: [id number] Visit Date: 06/14/2017              Requested by: Tally JoeSwayne, David, MD [phone] [address] [place], [place] [address] thanks PCP: Tally JoeSwayne, David, MD   Assessment & Plan: Visit Diagnoses:  1. Pain in left hip     Plan: The patient was encouraged seeing his x-rays today of his left hip compared to x-rays from 2 years ago.  Right now I would not recommend anything for his hip since his pain is minimal however the next step would likely be an intra-articular steroid injection if he continues to have any types of symptoms.  He will call and let us know.  The other option would be an MRI with an arthrogram of the left hip to rule out labral tear if he becomes symptomatic.  All questions concerns were answered and addressed.  We will otherwise follow-up as needed.  Follow-Up Instructions: Return if symptoms worsen or fail to improve.   Orders:  Orders Placed This Encounter  Procedures  . XR HIP UNILAT W OR W/O PELVIS 1V LEFT   No orders of the defined types were placed in this encounter.     Procedures: No procedures performed   Clinical Data: No additional findings.   Subjective: Chief Complaint  Patient presents with  . Left Hip - Pain  The patient is well-known to us.  He has a history of a right total hip arthroplasty 47 years old.  At that time he had severe arthritic changes in his right hip with almost near collapse of the hip joint.  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  There is only minimal signs though.  Starting in December of this past year he has had some problems with occasional sharp pain in his left hip but it does not last long.  This is only happened a few times and has not been significant but it was enough that he wanted us to take a look at his hip because he does not want it to get as  bad as his other hip had gotten before hip replacement surgery.  He denies any other acute changes in medical status.  He says his right hip is pain-free.  HPI  Review of Systems He currently denies any headache, chest pain, shortness of breath, fever, chills, nausea, vomiting.  Objective: Vital Signs: There were no vitals taken for this visit.  Physical Exam He is alert and oriented x3 and in no acute distress Ortho Exam Examination of his right hip is normal examination of his left hip shows full and fluid range of motion with no significant pain on the extremes of motion. Specialty Comments:  No specialty comments available.  Imaging: Xr Hip Unilat W Or W/o Pelvis 1v Left  Result Date: 06/14/2017 An AP pelvis and a lateral of his left hip shows a total hip arthroplasty on the right side with no complicating features and no evidence of loosening.  His left hip has slight evidence of femoral acetabular impingement but this is not changed from previous films compared to 2 years ago.  There is still well-maintained superior lateral space that is unchanged.    PMFS History: Patient Active Problem List   Diagnosis  Date Noted  . Avascular necrosis of bone of right hip (HCC) 06/14/2015  . Status post total replacement of right hip 06/14/2015   Past Medical History:  Diagnosis Date  . Avascular necrosis (HCC)    RT HIP  . DVT (deep venous thrombosis) (HCC)   . Hypertension    WAS TAKEN OFF BP MEDS BY PCP    History reviewed. No pertinent family history.  Past Surgical History:  Procedure Laterality Date  . PERIDONTAL SURGERY    . TOTAL HIP ARTHROPLASTY Right 06/14/2015   Procedure: RIGHT TOTAL HIP ARTHROPLASTY ANTERIOR APPROACH;  Surgeon: [name], MD;  Location: [facility];  Service: Orthopedics;  Laterality: Right;   Social History   Occupational History  . Not on file  Tobacco Use  . Smoking status: Never Smoker  . Smokeless tobacco: Never Used  Substance and  Sexual Activity  . Alcohol use: No  . Drug use: No  . Sexual activity: Not on file

## 2017-07-22 ENCOUNTER — Encounter: Payer: Self-pay | Admitting: Neurology

## 2017-07-22 ENCOUNTER — Ambulatory Visit (INDEPENDENT_AMBULATORY_CARE_PROVIDER_SITE_OTHER): Payer: PRIVATE HEALTH INSURANCE | Admitting: Neurology

## 2017-07-22 VITALS — BP 140/90 | HR 70 | Ht 68.0 in | Wt 178.2 lb

## 2017-07-22 DIAGNOSIS — R51 Headache: Secondary | ICD-10-CM

## 2017-07-22 DIAGNOSIS — R519 Headache, unspecified: Secondary | ICD-10-CM

## 2017-07-22 MED ORDER — NORTRIPTYLINE HCL 25 MG PO CAPS: 25.0000 mg | ORAL_CAPSULE | Freq: Every day | ORAL | 3 refills | Status: DC

## 2017-07-22 NOTE — Patient Instructions (Addendum)
1.  Start nortriptyline 25mg  at bedtime.  If headaches not improved by end of month, contact me and I will increase dose if needed. 2.  Limit use of pain relievers to no more than 2 days out of week to prevent rebound headache. 3.  Keep headache diary 4.  We will check MRI of brain with and without contrast. We have sent a referral [facility] and they will call you directly to schedule your appt. They are [address] at [address] If you need to contact them directly please call [phone].  5.  Follow up in 3 months.

## 2017-07-22 NOTE — Progress Notes (Signed)
NEUROLOGY CONSULTATION NOTE  Eric Johnston MRN: [id number] DOB: October 30, 1970  Referring provider: Dr. Azucena CecilSwayne Primary care provider: Dr. 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  He is accompanied by his wife who supplements history.  Onset:  2 years ago (2017) Location:  Usually left sided (rarely right sided) Quality:  Peri-orbital pressure, focal occipital pressure, sometimes shooting from front to back or vice versa Intensity:  Usually moderate.  He denies thunderclap headache or severe headache that wakes him from sleep.  However, he wakes up in the morning with it. Aura:  no Prodrome:  no Postdrome:  no Associated symptoms:  In the morning he has sinus congestion and runny nose.  No nausea, vomiting, photophobia, phonophobia, osmophobia, visual disturbance or unilateral numbness or weakness. Duration:  Usually no longer than 2 minutes Frequency:  Usually only during winter-spring, for 4-5 days a week, wakes up with it in the morning and may occur during the day. Triggers/exacerbating factors:  Unknown Relieving factors:  Blowing his nose helps Activity:  Does not aggravate  Current NSAIDS:  Aleve Current analgesics:  Tylenol Allergy and Sinus Current triptans:  no Current anti-emetic:  no Current muscle relaxants:  no Current anti-anxiolytic:  no Current sleep aide:  no Current Antihypertensive medications:  amlodipine Current Antidepressant medications:  no Current Anticonvulsant medications:  no Current Vitamins/Herbal/Supplements:  no Current Antihistamines/Decongestants:  Various sinus medication as needed Other therapy:  no Other medication:  no  Past NSAIDS:  no Past analgesics:  no Past abortive triptans:  no Past muscle relaxants:  no Past anti-emetic:  no Past  antihypertensive medications:  no Past antidepressant medications:  no Past anticonvulsant medications:  no Past vitamins/Herbal/Supplements:  no Past antihistamines/decongestants:  no Other past therapies:  no  Caffeine:  Mt. Dew every other day Alcohol:  no Smoker:  no Diet:  Tries to hydrate.  Mt Dew every other day Depression:  no; Anxiety:  yes Other pain:  no Sleep hygiene:  Poor.  Goes to sleep between 12 and 1 am, wakes up between 3 and 5 am Family history of headache:  unknown  PAST MEDICAL HISTORY: Past Medical History:  Diagnosis Date  . Avascular necrosis (HCC)    RT HIP  . DVT (deep venous thrombosis) (HCC)   . Headache   . Hypertension    WAS TAKEN OFF BP MEDS BY PCP    PAST SURGICAL HISTORY: Past Surgical History:  Procedure Laterality Date  . PERIDONTAL SURGERY    . TOTAL HIP ARTHROPLASTY Right 06/14/2015   Procedure: RIGHT TOTAL HIP ARTHROPLASTY ANTERIOR APPROACH;  Surgeon: Kathryne Hitchhristopher Y Blackman, MD;  Location: [facility];  Service: Orthopedics;  Laterality: Right;    MEDICATIONS: Current Outpatient Medications on File Prior to Visit  Medication Sig Dispense Refill  . aspirin EC 325 MG EC tablet Take 1 tablet (325 mg total) by mouth 2 (two) times daily after a meal. 30 tablet 0  . azithromycin (ZITHROMAX) 250 MG tablet Take first 2 tablets together, then 1 every day until finished. 6 tablet 0  . benzonatate (TESSALON) 100 MG capsule Take 1 capsule (100 mg total) by mouth every 8 (eight) hours. 21 capsule 0  . COD LIVER OIL PO Take 1 capsule by mouth daily.    . meloxicam (MOBIC) 7.5 MG tablet Take 2  tablets (15 mg total) by mouth daily. (Patient not taking: Reported on 06/03/2015) 30 tablet 0  . methocarbamol (ROBAXIN) 500 MG tablet Take 1 tablet (500 mg total) by mouth 2 (two) times daily. (Patient not taking: Reported on 06/03/2015) 20 tablet 0  . methocarbamol (ROBAXIN) 500 MG tablet Take 1 tablet (500 mg total) by mouth every 6 (six) hours as needed for  muscle spasms. 60 tablet 0  . Multiple Vitamin (MULTIVITAMIN WITH MINERALS) TABS tablet Take 1 tablet by mouth daily.    Marland Kitchen OVER THE COUNTER MEDICATION Take 1 tablet by mouth daily. [place]- Vitamin C immune health    . oxyCODONE-acetaminophen (PERCOCET/ROXICET) 5-325 MG tablet Take 1 tablet by mouth every 4 (four) hours as needed for severe pain. 16 tablet 0  . Rivaroxaban 15 & 20 MG TBPK Take as directed on package: Start with one 15mg  tablet by mouth twice a day with food. On Day 22, switch to one 20mg  tablet once a day with food. 51 each 0   No current facility-administered medications on file prior to visit.     ALLERGIES: No Known Allergies  FAMILY HISTORY: Family History  Problem Relation Age of Onset  . Hypertension Mother   . Hypertension Father   . Diabetes Father   . Colon cancer Father   . High Cholesterol Father     SOCIAL HISTORY: Social History   Socioeconomic History  . Marital status: Married    Spouse name: Nolberto Hanlon  . Number of children: 0  . Years of education: Not on file  . Highest education level: Bachelor's degree (e.g., BA, AB, BS)  Occupational History  . Occupation: Merchandiser, retail  . Financial resource strain: Not on file  . Food insecurity:    Worry: Not on file    Inability: Not on file  . Transportation needs:    Medical: Not on file    Non-medical: Not on file  Tobacco Use  . Smoking status: Never Smoker  . Smokeless tobacco: Never Used  Substance and Sexual Activity  . Alcohol use: No  . Drug use: No  . Sexual activity: Not on file  Lifestyle  . Physical activity:    Days per week: Not on file    Minutes per session: Not on file  . Stress: Not on file  Relationships  . Social connections:    Talks on phone: Not on file    Gets together: Not on file    Attends religious service: Not on file    Active member of club or organization: Not on file    Attends meetings of clubs or organizations: Not on file    Relationship  status: Not on file  . Intimate partner violence:    Fear of current or ex partner: Not on file    Emotionally abused: Not on file    Physically abused: Not on file    Forced sexual activity: Not on file  Other Topics Concern  . Not on file  Social History Narrative   Married, lives with wife in 2 story house. Pt is right handed. Drinks 1 Mt. Dew soda QOD. Exercises strenooulsy for 30 minutes QD.    REVIEW OF SYSTEMS: Constitutional: No fevers, chills, or sweats, no generalized fatigue, change in appetite Eyes: No visual changes, double vision, eye pain Ear, nose and throat: No hearing loss, ear pain, nasal congestion, sore throat Cardiovascular: No chest pain, palpitations Respiratory:  No shortness of breath at rest or with exertion, wheezes  GastrointestinaI: No nausea, vomiting, diarrhea, abdominal pain, fecal incontinence Genitourinary:  No dysuria, urinary retention or frequency Musculoskeletal:  No neck pain, back pain Integumentary: No rash, pruritus, skin lesions Neurological: as above Psychiatric: No depression, insomnia, anxiety Endocrine: No palpitations, fatigue, diaphoresis, mood swings, change in appetite, change in weight, increased thirst Hematologic/Lymphatic:  No purpura, petechiae. Allergic/Immunologic: no itchy/runny eyes, nasal congestion, recent allergic reactions, rashes  PHYSICAL EXAM: Vitals:   07/22/17 1010  BP: 140/90  Pulse: 70  SpO2: 98%   General: No acute distress.  Patient appears well-groomed.  Head:  Normocephalic/atraumatic Eyes:  fundi examined but not visualized Neck: supple, no paraspinal tenderness, full range of motion Back: No paraspinal tenderness Heart: regular rate and rhythm Lungs: Clear to auscultation bilaterally. Vascular: No carotid bruits. Neurological Exam: Mental status: alert and oriented to person, place, and time, recent and remote memory intact, fund of knowledge intact, attention and concentration intact, speech  fluent and not dysarthric, language intact. Cranial nerves: CN I: not tested CN II: pupils equal, round and reactive to light, visual fields intact CN III, IV, VI:  full range of motion, no nystagmus, no ptosis CN V: facial sensation intact CN VII: upper and lower face symmetric CN VIII: hearing intact CN IX, X: gag intact, uvula midline CN XI: sternocleidomastoid and trapezius muscles intact CN XII: tongue midline Bulk & Tone: normal, no fasciculations. Motor:  5/5 throughout  Sensation: temperature and vibration sensation intact. Deep Tendon Reflexes:  2+ throughout, toes downgoing.  Finger to nose testing:  Without dysmetria.  Heel to shin:  Without dysmetria.  Gait:  Normal station and stride.  Able to turn and tandem walk. Romberg negative.  IMPRESSION: Left sided headaches.  Semiology not clear for specific headache syndrome.  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  He does not have any neck or suboccipital pain to suggest cervicogenic component at this point.  Definitely not consistent with migraine or tension type headache.  Although typically rare, consider sinusitis given that it occurs during allergy season when he has significant congestion and improves when blowing his nose.  PLAN: 1.  Check MRI of brain with and without contrast for secondary cause, including sinuses. 2.  Start nortriptyline 25mg  at bedtime.  May increase dose in 4 weeks if needed. 3.  Limit pain relievers to no more than 2 days out of week to prevent rebound headache. 4.  Keep headache diary 5.  Follow up in 3 months.  Thank you for allowing me to take part in the care of this patient.  Shon Millet, DO  CC:  Tally Joe, MD

## 2017-08-03 ENCOUNTER — Encounter: Payer: Self-pay | Admitting: Neurology

## 2017-08-19 ENCOUNTER — Other Ambulatory Visit: Payer: PRIVATE HEALTH INSURANCE

## 2017-09-06 ENCOUNTER — Other Ambulatory Visit: Payer: PRIVATE HEALTH INSURANCE

## 2017-11-04 ENCOUNTER — Encounter

## 2017-11-04 ENCOUNTER — Ambulatory Visit: Payer: PRIVATE HEALTH INSURANCE | Admitting: Neurology

## 2020-05-06 ENCOUNTER — Encounter: Payer: Self-pay | Admitting: Allergy

## 2020-05-06 ENCOUNTER — Ambulatory Visit (INDEPENDENT_AMBULATORY_CARE_PROVIDER_SITE_OTHER): Payer: Commercial Managed Care - PPO | Admitting: Allergy

## 2020-05-06 ENCOUNTER — Other Ambulatory Visit: Payer: Self-pay

## 2020-05-06 VITALS — BP 130/80 | Temp 98.0°F | Wt 180.0 lb

## 2020-05-06 DIAGNOSIS — T783XXD Angioneurotic edema, subsequent encounter: Secondary | ICD-10-CM | POA: Diagnosis not present

## 2020-05-06 DIAGNOSIS — T783XXA Angioneurotic edema, initial encounter: Secondary | ICD-10-CM | POA: Insufficient documentation

## 2020-05-06 DIAGNOSIS — T781XXD Other adverse food reactions, not elsewhere classified, subsequent encounter: Secondary | ICD-10-CM | POA: Diagnosis not present

## 2020-05-06 DIAGNOSIS — L299 Pruritus, unspecified: Secondary | ICD-10-CM | POA: Insufficient documentation

## 2020-05-06 NOTE — Assessment & Plan Note (Signed)
Angioedema episode since June 2021 occurring every 1 to 2 weeks and lasting 3 to 5 days.  This can occur on his extremities, lips and face.  No respiratory compromise.  Episodes subsided since on zyrtec 10mg  daily.  Describes the areas as somewhat pruritic at times.  Denies any changes in diet, medications or personal care products.  Not on ace inhibitors. No recent infections however he had a tick bite around this time.  Denies frequent red meat consumption.  No family history of angioedema.  Up-to-date with colonoscopy.  Discussed with patient that based on clinical presentation not sure what's causing these episodes however they seem to be responsive to antihistamines.  Continue zyrtec (cetirizine) 10mg  once a day.  If symptoms are not controlled or causes drowsiness let know.  May take twice a day during flares.  . Avoid the following potential triggers: alcohol, tight clothing, NSAIDs, hot showers and getting overheated. Keep track of episodes and take pictures. . For mild symptoms you can take an additional over the counter antihistamines such as Benadryl and monitor symptoms closely. If symptoms worsen or if you have severe symptoms including breathing issues, throat closure, significant swelling, whole body hives, severe diarrhea and vomiting, lightheadedness then seek immediate medical care. . Get bloodwork to rule out any other underlying etiologies.

## 2020-05-06 NOTE — Assessment & Plan Note (Signed)
Crab ingestion caused vomiting in the past but tolerates other shellfish such as shrimp and lobster.  Denies any exposure to crabs during above episodes.  Continue to avoid crabs.

## 2020-05-06 NOTE — Patient Instructions (Addendum)
Swelling/itching:   Continue zyrtec (cetirizine) 10mg  once a day.  If symptoms are not controlled or causes drowsiness let know.  May take twice a day during flares.  . Avoid the following potential triggers: alcohol, tight clothing, NSAIDs, hot showers and getting overheated.  . For mild symptoms you can take an additional over the counter antihistamines such as Benadryl and monitor symptoms closely. If symptoms worsen or if you have severe symptoms including breathing issues, throat closure, significant swelling, whole body hives, severe diarrhea and vomiting, lightheadedness then seek immediate medical care.  . Get bloodwork:  o We are ordering labs, so please allow 1-2 weeks for the results to come back. o With the newly implemented Cures Act, the labs might be visible to you at the same time that they become visible to me. However, I will not address the results until all of the results are back, so please be patient.   Follow up in 2-3 months or sooner if needed.

## 2020-05-06 NOTE — Progress Notes (Signed)
New Patient Note  RE: Eric Johnston MRN: [id number] DOB: Dec 23, 1970 Date of Office Visit: 05/06/2020  Referring provider: Tally Joe, MD Primary care provider: Tally Joe, MD  Chief Complaint: Angioedema (Started with left foot to right foot and then left thumb. Right lip, eye, and thumb //Zyrtec improved swelling and did not stop for current visit due to fear of throat swelling) and Allergic Reaction (Late June early July the swelling started //Is allergic to crab but has not been exposed to it //Stopped all vitamins because he was not it that was the cause )  History of Present Illness: I had the pleasure of seeing Eric Johnston for initial evaluation at the [facility] on 05/06/2020. He is a 50 y.o. male, who is referred here by Tally Joe, MD for the evaluation of angioedema.  Angioedema started in June/July 2021. Patient started with itching of the left foot which then developed into swelling. Then about 1-2 week later he had left foot angioedema again.  Then a few weeks afterwards he had similar swelling on the right foot, then his left hand, left thumb x 2 episodes, right thumb and lips.   These episodes seem to occur every 1-2 weeks. He was seen by PCP and started zyrtec 10mg  daily which kept symptoms away until he missed 4 days of zyrtec and developed right periorbital swelling on January 12th, 2022.  Since then he has been very meticulous about taking his Zyrtec 10 mg every day with no subsequent angioedema episodes.  Describes them as itchy and swelling. Symptoms last about 3-5 days. No ecchymosis upon resolution. Associated symptoms include: none. Suspected triggers are unknown. Denies any fevers, chills, changes in medications, foods, personal care products or recent infections. Patient had COVID-19 infection in February 2021.   He has tried the following therapies: zyrtec with good benefit. Systemic steroids no. Currently on zyrtec 10mg  daily x  2 months. Previous work up includes: none. Previous history of rash/hives/angioedema: no. Patient is up to date with the following cancer screening tests: colonoscopy up to date.  Patient had a tick bite in July 2021 on his right calf area and now having issues with non-healing wound - followed by dermatology for this.  Assessment and Plan: Eric Johnston is a 50 y.o. male with: Angio-edema Angioedema episode since June 2021 occurring every 1 to 2 weeks and lasting 3 to 5 days.  This can occur on his extremities, lips and face.  No respiratory compromise.  Episodes subsided since on zyrtec 10mg  daily.  Describes the areas as somewhat pruritic at times.  Denies any changes in diet, medications or personal care products.  Not on ace inhibitors. No recent infections however he had a tick bite around this time.  Denies frequent red meat consumption.  No family history of angioedema.  Up-to-date with colonoscopy.  Discussed with patient that based on clinical presentation not sure what's causing these episodes however they seem to be responsive to antihistamines.  Continue zyrtec (cetirizine) 10mg  once a day.  If symptoms are not controlled or causes drowsiness let us know.  May take twice a day during flares.  . Avoid the following potential triggers: alcohol, tight clothing, NSAIDs, hot showers and getting overheated. Marland Kitchen Keep track of episodes and take pictures. . For mild symptoms you can take an additional over the counter antihistamines such as Benadryl and monitor symptoms closely. If symptoms worsen or if you have severe symptoms including breathing issues, throat closure, significant swelling,  whole body hives, severe diarrhea and vomiting, lightheadedness then seek immediate medical care. . Get bloodwork to rule out any other underlying etiologies.  Adverse reaction to food, subsequent encounter Crab ingestion caused vomiting in the past but tolerates other shellfish such as shrimp and lobster.   Denies any exposure to crabs during above episodes.  Continue to avoid crabs.  Return in about 2 months (around 07/04/2020).  Lab Orders     CBC with Differential/Platelet     C1 esterase inhibitor, functional     C1 Esterase Inhibitor     Chronic Urticaria     Comprehensive metabolic panel     C-reactive protein     Tryptase     Thyroid Cascade Profile     Sedimentation rate     Alpha-Gal Panel     ANA w/Reflex     Complement component c1q     C3 and C4  Other allergy screening: Asthma: no Rhino conjunctivitis: no Food allergy:  Crab caused vomiting but tolerates shrimp and lobster. Limited red meat consumption - once a month.  Medication allergy: no Hymenoptera allergy: no Urticaria: no Eczema:no History of recurrent infections suggestive of immunodeficency: no  Diagnostics: None.  Past Medical History: Patient Active Problem List   Diagnosis Date Noted  . Angio-edema 05/06/2020  . Pruritus 05/06/2020  . Adverse reaction to food, subsequent encounter 05/06/2020  . Avascular necrosis of bone of right hip (HCC) 06/14/2015  . Status post total replacement of right hip 06/14/2015   Past Medical History:  Diagnosis Date  . Angio-edema   . Avascular necrosis (HCC)    RT HIP  . DVT (deep venous thrombosis) (HCC)   . Headache   . Hypertension    WAS TAKEN OFF BP MEDS BY PCP   Past Surgical History: Past Surgical History:  Procedure Laterality Date  . PERIDONTAL SURGERY    . TOTAL HIP ARTHROPLASTY Right 06/14/2015   Procedure: RIGHT TOTAL HIP ARTHROPLASTY ANTERIOR APPROACH;  Surgeon: [name], MD;  Location: [facility];  Service: Orthopedics;  Laterality: Right;   Medication List:  Current Outpatient Medications  Medication Sig Dispense Refill  . amLODipine (NORVASC) 10 MG tablet Take 5 mg by mouth daily.    Marland Kitchen aspirin EC 325 MG EC tablet Take 1 tablet (325 mg total) by mouth 2 (two) times daily after a meal. 30 tablet 0  . COD LIVER OIL PO Take 1  capsule by mouth daily.    . Multiple Vitamin (MULTIVITAMIN WITH MINERALS) TABS tablet Take 1 tablet by mouth daily.    Marland Kitchen OVER THE COUNTER MEDICATION Take 1 tablet by mouth daily. [place]- Vitamin C immune health    . azithromycin (ZITHROMAX) 250 MG tablet Take first 2 tablets together, then 1 every day until finished. (Patient not taking: No sig reported) 6 tablet 0  . benzonatate (TESSALON) 100 MG capsule Take 1 capsule (100 mg total) by mouth every 8 (eight) hours. (Patient not taking: No sig reported) 21 capsule 0  . meloxicam (MOBIC) 7.5 MG tablet Take 2 tablets (15 mg total) by mouth daily. (Patient not taking: No sig reported) 30 tablet 0  . methocarbamol (ROBAXIN) 500 MG tablet Take 1 tablet (500 mg total) by mouth 2 (two) times daily. (Patient not taking: No sig reported) 20 tablet 0  . methocarbamol (ROBAXIN) 500 MG tablet Take 1 tablet (500 mg total) by mouth every 6 (six) hours as needed for muscle spasms. (Patient not taking: Reported on 07/22/2017) 60 tablet 0  .  nortriptyline (PAMELOR) 25 MG capsule Take 1 capsule (25 mg total) by mouth at bedtime. (Patient not taking: Reported on 05/06/2020) 30 capsule 3  . oxyCODONE-acetaminophen (PERCOCET/ROXICET) 5-325 MG tablet Take 1 tablet by mouth every 4 (four) hours as needed for severe pain. (Patient not taking: Reported on 05/06/2020) 16 tablet 0  . Rivaroxaban 15 & 20 MG TBPK Take as directed on package: Start with one 15mg  tablet by mouth twice a day with food. On Day 22, switch to one 20mg  tablet once a day with food. (Patient not taking: Reported on 05/06/2020) 51 each 0   No current facility-administered medications for this visit.   Allergies: No Known Allergies Social History: Social History   Socioeconomic History  . Marital status: Married    Spouse name:  . Number of children: 0  . Years of education: Not on file  . Highest education level: Bachelor's degree (e.g., BA, AB, BS)  Occupational History  . Occupation:  Retail  Tobacco Use  . Smoking status: Never Smoker  . Smokeless tobacco: Never Used  Vaping Use  . Vaping Use: Never used  Substance and Sexual Activity  . Alcohol use: No  . Drug use: No  . Sexual activity: Not on file  Other Topics Concern  . Not on file  Social History Narrative   Married, lives with wife in 2 story house. Pt is right handed. Drinks 1 Mt. Dew soda QOD. Exercises strenooulsy for 30 minutes QD.   Social Determinants of Health   Financial Resource Strain: Not on file  Food Insecurity: Not on file  Transportation Needs: Not on file  Physical Activity: Not on file  Stress: Not on file  Social Connections: Not on file   Lives in a 49 year old home. Smoking: denies Occupation: [employer]ber[employer] Hanlon History: Water Damage/mildew in the house: yes Carpet in the family room: no Carpet in the bedroom: yes Heating: electric Cooling: central Pet: yes 3 dogs x 10 yrs  Family History: Family History  Problem Relation Age of Onset  . Hypertension Mother   . Hypertension Father   . Diabetes Father   . Colon cancer Father   . High Cholesterol Father    Problem                               Relation Asthma                                   No  Eczema                                No  Food allergy                          No  Allergic rhino conjunctivitis     No  Angioedema    No  Review of Systems  Constitutional: Negative for appetite change, chills, fever and unexpected weight change.  HENT: Negative for congestion and rhinorrhea.   Eyes: Negative for itching.  Respiratory: Negative for cough, chest tightness, shortness of breath and wheezing.   Cardiovascular: Negative for chest pain.  Gastrointestinal: Negative for abdominal pain.  Genitourinary: Negative for difficulty urinating.  Skin: Positive for rash.  Neurological: Negative for headaches.   Objective:  BP 130/80   Temp 98 F (36.7 C)   Wt 180 lb (81.6 kg)   BMI 27.37 kg/m   Body mass index is 27.37 kg/m. Physical Exam Vitals and nursing note reviewed.  Constitutional:      Appearance: Normal appearance. He is well-developed.  HENT:     Head: Normocephalic and atraumatic.     Right Ear: Tympanic membrane and external ear normal.     Left Ear: Tympanic membrane and external ear normal.     Nose: Nose normal.     Mouth/Throat:     Mouth: Mucous membranes are moist.     Pharynx: Oropharynx is clear.  Eyes:     Conjunctiva/sclera: Conjunctivae normal.  Cardiovascular:     Rate and Rhythm: Normal rate and regular rhythm.     Heart sounds: Normal heart sounds. No murmur heard. No friction rub. No gallop.   Pulmonary:     Effort: Pulmonary effort is normal.     Breath sounds: Normal breath sounds. No wheezing, rhonchi or rales.  Musculoskeletal:     Cervical back: Neck supple.  Skin:    General: Skin is warm.     Findings: Lesion present.     Comments: Right calf lesion - non-healing wound with hypopigmented center.   Neurological:     Mental Status: He is alert and oriented to person, place, and time.  Psychiatric:        Behavior: Behavior normal.    The plan was reviewed with the patient/family, and all questions/concerned were addressed.  It was my pleasure to see Ashwath today and participate in his care. Please feel free to contact me with any questions or concerns.  Sincerely,  Wyline Mood, DO Allergy & Immunology  [facility] office: [phone] [facility] office: [phone]

## 2020-05-07 LAB — CBC WITH DIFFERENTIAL/PLATELET
Lymphocytes Absolute: 1.5 10*3/uL (ref 0.7–3.1)
Neutrophils Absolute: 2.1 10*3/uL (ref 1.4–7.0)

## 2020-05-07 LAB — COMPREHENSIVE METABOLIC PANEL
Creatinine, Ser: 0.95 mg/dL (ref 0.76–1.27)
GFR calc non Af Amer: 94 mL/min/{1.73_m2} (ref >59)

## 2020-05-08 LAB — C3 AND C4: Complement C4, Serum: 26 mg/dL (ref 12–38)

## 2020-05-08 LAB — COMPREHENSIVE METABOLIC PANEL
Albumin: 4.3 g/dL (ref 4.0–5.0)
Chloride: 101 mmol/L (ref 96–106)

## 2020-05-08 LAB — CBC WITH DIFFERENTIAL/PLATELET
MCV: 77 fL — ABNORMAL LOW (ref 79–97)
RBC: 5.76 x10E6/uL (ref 4.14–5.80)

## 2020-05-08 LAB — TRYPTASE

## 2020-05-09 LAB — CBC WITH DIFFERENTIAL/PLATELET
Basophils Absolute: 0 10*3/uL (ref 0.0–0.2)
Basos: 1 %
EOS (ABSOLUTE): 0.2 10*3/uL (ref 0.0–0.4)
Eos: 3 %
Hemoglobin: 14.4 g/dL (ref 13.0–17.7)
Monocytes: 15 %

## 2020-05-09 LAB — ANA W/REFLEX: Anti Nuclear Antibody (ANA): NEGATIVE

## 2020-05-09 LAB — ALPHA-GAL PANEL

## 2020-05-09 LAB — COMPREHENSIVE METABOLIC PANEL: AST: 8 IU/L (ref 0–40)

## 2020-05-10 LAB — COMPREHENSIVE METABOLIC PANEL: Bilirubin Total: 0.3 mg/dL (ref 0.0–1.2)

## 2020-05-10 LAB — CBC WITH DIFFERENTIAL/PLATELET: RDW: 13.7 % (ref 11.6–15.4)

## 2020-05-14 LAB — CBC WITH DIFFERENTIAL/PLATELET: MCH: 25 pg — ABNORMAL LOW (ref 26.6–33.0)

## 2020-05-14 LAB — COMPREHENSIVE METABOLIC PANEL: Sodium: 139 mmol/L (ref 134–144)

## 2020-05-14 LAB — ALPHA-GAL PANEL: IgE (Immunoglobulin E), Serum: 407 IU/mL (ref 6–495)

## 2020-05-14 LAB — SEDIMENTATION RATE: Sed Rate: 18 mm/hr — ABNORMAL HIGH (ref 0–15)

## 2020-05-15 LAB — CBC WITH DIFFERENTIAL/PLATELET
Hematocrit: 44.1 % (ref 37.5–51.0)
Immature Grans (Abs): 0 10*3/uL (ref 0.0–0.1)
Immature Granulocytes: 0 %
Lymphs: 34 %
MCHC: 32.7 g/dL (ref 31.5–35.7)
Monocytes Absolute: 0.7 10*3/uL (ref 0.1–0.9)
Neutrophils: 47 %
Platelets: 190 10*3/uL (ref 150–450)
WBC: 4.5 10*3/uL (ref 3.4–10.8)

## 2020-05-15 LAB — COMPREHENSIVE METABOLIC PANEL
ALT: 16 IU/L (ref 0–44)
Albumin/Globulin Ratio: 1.4 (ref 1.2–2.2)
Alkaline Phosphatase: 91 IU/L (ref 44–121)
BUN/Creatinine Ratio: 13 (ref 9–20)
BUN: 12 mg/dL (ref 6–24)
CO2: 26 mmol/L (ref 20–29)
Calcium: 9.3 mg/dL (ref 8.7–10.2)
GFR calc Af Amer: 108 mL/min/{1.73_m2} (ref >59)
Globulin, Total: 3.1 g/dL (ref 1.5–4.5)
Glucose: 93 mg/dL (ref 65–99)
Potassium: 5 mmol/L (ref 3.5–5.2)
Total Protein: 7.4 g/dL (ref 6.0–8.5)

## 2020-05-15 LAB — ALPHA-GAL PANEL
Allergen Lamb IgE: <0.1 kU/L
Beef IgE: <0.1 kU/L
O215-IgE Alpha-Gal: <0.1 kU/L
Pork IgE: <0.1 kU/L

## 2020-05-15 LAB — C-REACTIVE PROTEIN: CRP: <1 mg/L (ref 0–10)

## 2020-05-15 LAB — COMPLEMENT COMPONENT C1Q: Complement C1Q: 14.1 mg/dL (ref 10.2–20.3)

## 2020-05-15 LAB — C1 ESTERASE INHIBITOR, FUNCTIONAL: C1INH Functional/C1INH Total MFr SerPl: >91 %mean normal

## 2020-05-15 LAB — THYROID CASCADE PROFILE: TSH: 0.87 u[IU]/mL (ref 0.450–4.500)

## 2020-05-15 LAB — C3 AND C4: Complement C3, Serum: 116 mg/dL (ref 82–167)

## 2020-05-15 LAB — C1 ESTERASE INHIBITOR: C1INH SerPl-mCnc: 29 mg/dL (ref 21–39)

## 2020-05-15 LAB — CHRONIC URTICARIA: cu index: 6.9 (ref <10)

## 2020-07-04 ENCOUNTER — Ambulatory Visit: Payer: Commercial Managed Care - PPO | Admitting: Family Medicine

## 2020-07-04 ENCOUNTER — Telehealth: Payer: Self-pay

## 2020-07-04 DIAGNOSIS — J309 Allergic rhinitis, unspecified: Secondary | ICD-10-CM

## 2020-07-04 NOTE — Progress Notes (Deleted)
   [address] [place] [address] Dept: [phone]  FOLLOW UP NOTE  Patient ID: Eric Johnston, male    DOB: 01-Mar-1971  Age: 49 y.o. MRN: [id number] Date of Office Visit: 07/04/2020  Assessment  Chief Complaint: No chief complaint on file.  HPI Lindwood Mogel    Drug Allergies:  No Known Allergies  Physical Exam: There were no vitals taken for this visit.   Physical Exam  Diagnostics:    Assessment and Plan: No diagnosis found.  No orders of the defined types were placed in this encounter.   There are no Patient Instructions on file for this visit.  No follow-ups on file.    Thank you for the opportunity to care for this patient.  Please do not hesitate to contact me with questions.  Thermon Leyland, FNP [facility]

## 2020-07-04 NOTE — Telephone Encounter (Signed)
Called and left a message notifying patient that he missed his appointment with Thurston Hole at 930 on 3/31. Asked patient to call our office when he is ready to reschedule.

## 2020-07-04 NOTE — Patient Instructions (Incomplete)
Angioedema Continue cetirizine 10 mg once a day  Food allergy Continue to avoid crab. In case of an allergic reaction, give Benadryl *** {Blank single:19197::"teaspoonful","teaspoonfuls","capsules"} every {blank single:19197::"4","6"} hours, and if life-threatening symptoms occur, inject with {Blank single:19197::"EpiPen 0.3 mg","EpiPen Jr. 0.15 mg","AuviQ 0.3 mg","AuviQ 0.15 mg","AuviQ 0.10 mg"}.  Call the clinic if this treatment plan is not working well for you  Follow up in *** or sooner if needed.

## 2020-10-30 ENCOUNTER — Ambulatory Visit (INDEPENDENT_AMBULATORY_CARE_PROVIDER_SITE_OTHER): Payer: Commercial Managed Care - PPO | Admitting: Orthopedic Surgery

## 2020-10-30 ENCOUNTER — Ambulatory Visit: Payer: Self-pay

## 2020-10-30 ENCOUNTER — Encounter: Payer: Self-pay | Admitting: Orthopaedic Surgery

## 2020-10-30 DIAGNOSIS — M25552 Pain in left hip: Secondary | ICD-10-CM | POA: Diagnosis not present

## 2020-10-30 DIAGNOSIS — M25522 Pain in left elbow: Secondary | ICD-10-CM

## 2020-10-30 DIAGNOSIS — M1612 Unilateral primary osteoarthritis, left hip: Secondary | ICD-10-CM | POA: Diagnosis not present

## 2020-10-30 DIAGNOSIS — S46212A Strain of muscle, fascia and tendon of other parts of biceps, left arm, initial encounter: Secondary | ICD-10-CM

## 2020-10-30 NOTE — Progress Notes (Signed)
Office Visit Note   Patient: Eric Johnston           Date of Birth: 11/06/1970           MRN: [id number] Visit Date: 10/30/2020 Requested by: Tally Joe, MD [phone] [place],  [place] [address] PCP: Tally Joe, MD  Subjective: Chief Complaint  Patient presents with   Left Hip - Pain   Left Elbow - Pain    HPI: Eric Johnston is a 50 year old patient with left elbow pain.  Injury occurred 2 days ago when he was working out.  He was seen in the urgent care and believed to have distal biceps rupture.  He is right-hand dominant.  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  Has had good result with right hip replacement and has some left hip arthritis as well.              ROS: All systems reviewed are negative as they relate to the chief complaint within the history of present illness.  Patient denies  fevers or chills.   Assessment & Plan: Visit Diagnoses:  1. Pain in left hip   2. Pain in left elbow   3. Unilateral primary osteoarthritis, left hip   4. Rupture of left distal biceps tendon, initial encounter     Plan: Impression is left distal biceps rupture.  Patient has no palpable tendon in the antecubital fossa along with weakness to supination bruising around the medial condyle region as well as absence of visible tendon on ultrasound examination.  Plan is distal biceps tendon rupture repair.  Risk and benefits are discussed with the patient including not limited to infection nerve vessel damage heterotopic ossification as well as limited range of motion and strength with possible need for activity modification in the future regarding his workout routine.  Patient understands risk and benefits and wishes to proceed.  Expected rehabilitation course also discussed.  All questions answered.  Follow-Up Instructions: No follow-ups on file.   Orders:  Orders Placed This Encounter  Procedures   XR Elbow Complete Left (3+View)   XR HIP UNILAT W OR W/O PELVIS  2-3 VIEWS LEFT   No orders of the defined types were placed in this encounter.     Procedures: No procedures performed   Clinical Data: No additional findings.  Objective: Vital Signs: There were no vitals taken for this visit.  Physical Exam:   Constitutional: Patient appears well-developed HEENT:  Head: Normocephalic Eyes:EOM are normal Neck: Normal range of motion Cardiovascular: Normal rate Pulmonary/chest: Effort normal Neurologic: Patient is alert Skin: Skin is warm Psychiatric: Patient has normal mood and affect   Ortho Exam: Ortho exam demonstrates full active and passive range of motion of the right arm.  On the left-hand side he has a positive hook test for absence of palpable biceps tendon.  Weakness of 4- out of 5 supination strength on the left compared to the right.  There is some ecchymosis and bruising around the medial epicondyle in pronators mass.  Does have some weakness with biceps resistance strength testing is well on the left side.  Pronation supination is full.  Radial pulse is intact.  No paresthesias on the lateral forearm on the left.  Specialty Comments:  No specialty comments available.  Imaging: XR Elbow Complete Left (3+View)  Result Date: 10/30/2020 2 views of the left elbow show no acute findings.  XR HIP UNILAT W OR W/O PELVIS 2-3 VIEWS LEFT  Result Date: 10/30/2020 An AP pelvis and lateral left hip shows a total hip arthroplasty on the right side.  The left hip shows slowly worsening arthritis when compared to previous films.  There is superior lateral joint space narrowing as well as para-articular osteophytes around the inferior femoral head and superior lateral acetabulum.  There is some slight flattening of the lateral femoral head neck area.  This is worsened when compared to previous films from a few years ago.    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/2015   Past Medical History:  Diagnosis Date   Angio-edema    Avascular necrosis (HCC)    RT HIP   DVT (deep venous thrombosis) (HCC)    Headache    Hypertension    WAS TAKEN OFF BP MEDS BY PCP    Family History  Problem Relation Age of Onset   Hypertension Mother    Hypertension Father    Diabetes Father    Colon cancer Father    High Cholesterol Father     Past Surgical History:  Procedure Laterality Date   PERIDONTAL SURGERY     TOTAL HIP ARTHROPLASTY Right 06/14/2015   Procedure: RIGHT TOTAL HIP ARTHROPLASTY ANTERIOR APPROACH;  Surgeon: [name], MD;  Location: [facility];  Service: Orthopedics;  Laterality: Right;   Social History   Occupational History   Occupation: Retail  Tobacco Use   Smoking status: Never   Smokeless tobacco: Never  Vaping Use   Vaping Use: Never used  Substance and Sexual Activity   Alcohol use: No   Drug use: No   Sexual activity: Not on file

## 2020-10-30 NOTE — Progress Notes (Signed)
The patient is well-known to me.  He was originally coming in today to be seen for some chronic left hip pain.  Actually replaced his right hip around 2017.  He is only 50 years old.  His left hip has been slowly getting more stiff with some pain in the groin.  However, he sustained an acute injury to his left arm just 2 days ago on Monday.  He was seen in urgent care center and was felt that he had a distal biceps tendon rupture.  On my exam today he does have findings consistent with a distal biceps tendon rupture.  He is only 50 years old and very active.  He understands that I am going to have my partner Dr. August Saucer see him today for this acute injury due to the likely need for surgery.  His left hip is stiff on internal and external rotation and x-rays of the left hip shows some worsening arthritic findings in terms of superior lateral joint space narrowing and peritubular osteophytes.  This is worsened when compared to previous films from 2017.  His right total hip arthroplasty is doing well.  Unguinal have Dr. August Saucer look at him today for the left distal biceps tendon.  We can always see him at a later date for his hip but I think his distal biceps on the left side is a more pressing issue and the patient agrees with this as well.

## 2020-10-31 ENCOUNTER — Telehealth: Payer: Self-pay | Admitting: Orthopedic Surgery

## 2020-10-31 ENCOUNTER — Other Ambulatory Visit: Payer: Self-pay

## 2020-10-31 ENCOUNTER — Encounter (HOSPITAL_BASED_OUTPATIENT_CLINIC_OR_DEPARTMENT_OTHER): Payer: Self-pay | Admitting: Orthopedic Surgery

## 2020-10-31 NOTE — Telephone Encounter (Signed)
Pt called requesting a call back. Pt has additional question about his recovery time after his surgery on this coming Monday. Pt phone number is [phone].

## 2020-11-01 ENCOUNTER — Encounter (HOSPITAL_BASED_OUTPATIENT_CLINIC_OR_DEPARTMENT_OTHER)
Admission: RE | Admit: 2020-11-01 | Discharge: 2020-11-01 | Disposition: A | Discharge status: Home / Self Care | Payer: Commercial Managed Care - PPO | Source: Ambulatory Visit | Attending: Orthopedic Surgery | Admitting: Orthopedic Surgery

## 2020-11-01 DIAGNOSIS — Z01818 Encounter for other preprocedural examination: Secondary | ICD-10-CM | POA: Diagnosis not present

## 2020-11-01 LAB — BASIC METABOLIC PANEL
Anion gap: 5 (ref 5–15)
BUN: 11 mg/dL (ref 6–20)
CO2: 26 mmol/L (ref 22–32)
Calcium: 9 mg/dL (ref 8.9–10.3)
Chloride: 105 mmol/L (ref 98–111)
Creatinine, Ser: 0.98 mg/dL (ref 0.61–1.24)
GFR, Estimated: >60 mL/min (ref >60)
Glucose, Bld: 100 mg/dL — ABNORMAL HIGH (ref 70–99)
Potassium: 4.3 mmol/L (ref 3.5–5.1)
Sodium: 136 mmol/L (ref 135–145)

## 2020-11-01 LAB — CBC
HCT: 45.4 % (ref 39.0–52.0)
Hemoglobin: 14.1 g/dL (ref 13.0–17.0)
MCH: 25 pg — ABNORMAL LOW (ref 26.0–34.0)
MCHC: 31.1 g/dL (ref 30.0–36.0)
MCV: 80.6 fL (ref 80.0–100.0)
Platelets: 197 10*3/uL (ref 150–400)
RBC: 5.63 MIL/uL (ref 4.22–5.81)
RDW: 13.6 % (ref 11.5–15.5)
WBC: 4.5 10*3/uL (ref 4.0–10.5)
nRBC: 0 % (ref 0.0–0.2)

## 2020-11-01 NOTE — Progress Notes (Signed)

## 2020-11-01 NOTE — Telephone Encounter (Signed)
I called lmom

## 2020-11-03 NOTE — Anesthesia Preprocedure Evaluation (Addendum)
Anesthesia Evaluation  Patient identified by MRN, date of birth, ID band Patient awake    Reviewed: Allergy & Precautions, NPO status , Patient's Chart, lab work & pertinent test results  History of Anesthesia Complications (+) PONV and history of anesthetic complications  Airway Mallampati: III  TM Distance: >3 FB Neck ROM: Full    Dental  (+) Dental Advisory Given, Teeth Intact   Pulmonary neg pulmonary ROS,    Pulmonary exam normal breath sounds clear to auscultation       Cardiovascular hypertension, Pt. on medications + DVT  Normal cardiovascular exam Rhythm:Regular Rate:Normal     Neuro/Psych  Headaches, negative psych ROS   GI/Hepatic negative GI ROS, Neg liver ROS,   Endo/Other  negative endocrine ROS  Renal/GU negative Renal ROS  negative genitourinary   Musculoskeletal L distal biceps rupture   Abdominal   Peds  Hematology negative hematology ROS (+)   Anesthesia Other Findings   Reproductive/Obstetrics negative OB ROS                            Anesthesia Physical Anesthesia Plan  ASA: 2  Anesthesia Plan: General and Regional   Post-op Pain Management: GA combined w/ Regional for post-op pain   Induction: Intravenous  PONV Risk Score and Plan: 3 and Ondansetron, Dexamethasone, Midazolam, Treatment may vary due to age or medical condition and Scopolamine patch - Pre-op  Airway Management Planned: LMA  Additional Equipment: None  Intra-op Plan:   Post-operative Plan: Extubation in OR  Informed Consent: I have reviewed the patients History and Physical, chart, labs and discussed the procedure including the risks, benefits and alternatives for the proposed anesthesia with the patient or authorized representative who has indicated his/her understanding and acceptance.     Dental advisory given  Plan Discussed with: CRNA  Anesthesia Plan Comments:        Anesthesia Quick Evaluation

## 2020-11-03 NOTE — Telephone Encounter (Signed)
Lmom again

## 2020-11-04 ENCOUNTER — Ambulatory Visit (HOSPITAL_BASED_OUTPATIENT_CLINIC_OR_DEPARTMENT_OTHER): Payer: Commercial Managed Care - PPO | Admitting: Anesthesiology

## 2020-11-04 ENCOUNTER — Encounter (HOSPITAL_BASED_OUTPATIENT_CLINIC_OR_DEPARTMENT_OTHER): Payer: Self-pay | Admitting: Orthopedic Surgery

## 2020-11-04 ENCOUNTER — Encounter (HOSPITAL_BASED_OUTPATIENT_CLINIC_OR_DEPARTMENT_OTHER)
Admission: RE | Disposition: A | Discharge status: Home / Self Care | Payer: Self-pay | Source: Home / Self Care | Attending: Orthopedic Surgery

## 2020-11-04 ENCOUNTER — Other Ambulatory Visit: Payer: Self-pay

## 2020-11-04 ENCOUNTER — Ambulatory Visit (HOSPITAL_BASED_OUTPATIENT_CLINIC_OR_DEPARTMENT_OTHER)
Admission: RE | Admit: 2020-11-04 | Discharge: 2020-11-04 | Disposition: A | Discharge status: Home / Self Care | Payer: Commercial Managed Care - PPO | Attending: Orthopedic Surgery | Admitting: Orthopedic Surgery

## 2020-11-04 DIAGNOSIS — S46212A Strain of muscle, fascia and tendon of other parts of biceps, left arm, initial encounter: Secondary | ICD-10-CM | POA: Insufficient documentation

## 2020-11-04 DIAGNOSIS — Z7901 Long term (current) use of anticoagulants: Secondary | ICD-10-CM | POA: Insufficient documentation

## 2020-11-04 DIAGNOSIS — Z7982 Long term (current) use of aspirin: Secondary | ICD-10-CM | POA: Diagnosis not present

## 2020-11-04 DIAGNOSIS — Y9389 Activity, other specified: Secondary | ICD-10-CM | POA: Diagnosis not present

## 2020-11-04 DIAGNOSIS — S46212D Strain of muscle, fascia and tendon of other parts of biceps, left arm, subsequent encounter: Secondary | ICD-10-CM

## 2020-11-04 DIAGNOSIS — S46211A Strain of muscle, fascia and tendon of other parts of biceps, right arm, initial encounter: Secondary | ICD-10-CM | POA: Diagnosis present

## 2020-11-04 DIAGNOSIS — X58XXXA Exposure to other specified factors, initial encounter: Secondary | ICD-10-CM | POA: Diagnosis not present

## 2020-11-04 DIAGNOSIS — Z96641 Presence of right artificial hip joint: Secondary | ICD-10-CM | POA: Insufficient documentation

## 2020-11-04 DIAGNOSIS — Z79899 Other long term (current) drug therapy: Secondary | ICD-10-CM | POA: Diagnosis not present

## 2020-11-04 DIAGNOSIS — Z86718 Personal history of other venous thrombosis and embolism: Secondary | ICD-10-CM | POA: Diagnosis not present

## 2020-11-04 DIAGNOSIS — Z791 Long term (current) use of non-steroidal anti-inflammatories (NSAID): Secondary | ICD-10-CM | POA: Diagnosis not present

## 2020-11-04 HISTORY — DX: Other specified postprocedural states: R11.2

## 2020-11-04 HISTORY — DX: Nausea with vomiting, unspecified: Z98.890

## 2020-11-04 HISTORY — PX: DISTAL BICEPS TENDON REPAIR: SHX1461

## 2020-11-04 SURGERY — REPAIR, TENDON, BICEPS, DISTAL
Anesthesia: Regional | Site: Arm Upper | Laterality: Left

## 2020-11-04 MED ORDER — PROMETHAZINE HCL 25 MG/ML IJ SOLN: 6.2500 mg | INTRAMUSCULAR | Status: DC | PRN

## 2020-11-04 MED ORDER — DEXAMETHASONE SODIUM PHOSPHATE 10 MG/ML IJ SOLN
INTRAMUSCULAR | Status: DC | PRN
  Administered 2020-11-04: 10 mg

## 2020-11-04 MED ORDER — ONDANSETRON HCL 4 MG/2ML IJ SOLN
INTRAMUSCULAR | Status: DC | PRN
  Administered 2020-11-04: 4 mg via INTRAVENOUS

## 2020-11-04 MED ORDER — ACETAMINOPHEN 500 MG PO TABS
1000.0000 mg | ORAL_TABLET | Freq: Once | ORAL | Status: AC
  Administered 2020-11-04: 1000 mg via ORAL

## 2020-11-04 MED ORDER — LIDOCAINE HCL (CARDIAC) PF 100 MG/5ML IV SOSY
PREFILLED_SYRINGE | INTRAVENOUS | Status: DC | PRN
  Administered 2020-11-04: 60 mg via INTRATRACHEAL

## 2020-11-04 MED ORDER — EPHEDRINE SULFATE 50 MG/ML IJ SOLN
INTRAMUSCULAR | Status: DC | PRN
  Administered 2020-11-04 (×5): 10 mg via INTRAVENOUS

## 2020-11-04 MED ORDER — PHENYLEPHRINE 40 MCG/ML (10ML) SYRINGE FOR IV PUSH (FOR BLOOD PRESSURE SUPPORT)
PREFILLED_SYRINGE | INTRAVENOUS | Status: AC
  Filled 2020-11-04: qty 10

## 2020-11-04 MED ORDER — CEFAZOLIN SODIUM-DEXTROSE 2-4 GM/100ML-% IV SOLN
2.0000 g | INTRAVENOUS | Status: AC
  Administered 2020-11-04: 2 g via INTRAVENOUS

## 2020-11-04 MED ORDER — 0.9 % SODIUM CHLORIDE (POUR BTL) OPTIME
TOPICAL | Status: DC | PRN
  Administered 2020-11-04: 2000 mL

## 2020-11-04 MED ORDER — FENTANYL CITRATE (PF) 100 MCG/2ML IJ SOLN
100.0000 ug | Freq: Once | INTRAMUSCULAR | Status: AC
  Administered 2020-11-04: 100 ug via INTRAVENOUS

## 2020-11-04 MED ORDER — PROPOFOL 500 MG/50ML IV EMUL
INTRAVENOUS | Status: AC
  Filled 2020-11-04: qty 50

## 2020-11-04 MED ORDER — OXYCODONE HCL 5 MG PO TABS: 5.0000 mg | ORAL_TABLET | Freq: Once | ORAL | Status: DC | PRN

## 2020-11-04 MED ORDER — POVIDONE-IODINE 10 % EX SWAB: 2.0000 "application " | Freq: Once | CUTANEOUS | Status: DC

## 2020-11-04 MED ORDER — SCOPOLAMINE 1 MG/3DAYS TD PT72
MEDICATED_PATCH | TRANSDERMAL | Status: AC
  Filled 2020-11-04: qty 1

## 2020-11-04 MED ORDER — MIDAZOLAM HCL 2 MG/2ML IJ SOLN
INTRAMUSCULAR | Status: AC
  Filled 2020-11-04: qty 2

## 2020-11-04 MED ORDER — CEFAZOLIN SODIUM-DEXTROSE 2-4 GM/100ML-% IV SOLN
INTRAVENOUS | Status: AC
  Filled 2020-11-04: qty 100

## 2020-11-04 MED ORDER — BUPIVACAINE HCL (PF) 0.5 % IJ SOLN
INTRAMUSCULAR | Status: DC | PRN
  Administered 2020-11-04: 25 mL via PERINEURAL

## 2020-11-04 MED ORDER — PHENYLEPHRINE HCL (PRESSORS) 10 MG/ML IV SOLN
INTRAVENOUS | Status: AC
  Filled 2020-11-04: qty 1

## 2020-11-04 MED ORDER — ACETAMINOPHEN 500 MG PO TABS
ORAL_TABLET | ORAL | Status: AC
  Filled 2020-11-04: qty 2

## 2020-11-04 MED ORDER — METHOCARBAMOL 500 MG PO TABS: 500.0000 mg | ORAL_TABLET | Freq: Three times a day (TID) | ORAL | 0 refills | Status: DC | PRN

## 2020-11-04 MED ORDER — OXYCODONE-ACETAMINOPHEN 5-325 MG PO TABS: 1.0000 | ORAL_TABLET | ORAL | 0 refills | Status: AC | PRN

## 2020-11-04 MED ORDER — HYDROMORPHONE HCL 1 MG/ML IJ SOLN: 0.2500 mg | INTRAMUSCULAR | Status: DC | PRN

## 2020-11-04 MED ORDER — KETOROLAC TROMETHAMINE 30 MG/ML IJ SOLN: 30.0000 mg | Freq: Once | INTRAMUSCULAR | Status: DC | PRN

## 2020-11-04 MED ORDER — PROPOFOL 10 MG/ML IV BOLUS
INTRAVENOUS | Status: AC
  Filled 2020-11-04: qty 20

## 2020-11-04 MED ORDER — KETOROLAC TROMETHAMINE 30 MG/ML IJ SOLN
INTRAMUSCULAR | Status: DC | PRN
  Administered 2020-11-04: 30 mg via INTRAVENOUS

## 2020-11-04 MED ORDER — PHENYLEPHRINE HCL-NACL 10-0.9 MG/250ML-% IV SOLN
INTRAVENOUS | Status: DC | PRN
  Administered 2020-11-04: 30 ug/min via INTRAVENOUS

## 2020-11-04 MED ORDER — MIDAZOLAM HCL 2 MG/2ML IJ SOLN
2.0000 mg | Freq: Once | INTRAMUSCULAR | Status: AC
  Administered 2020-11-04: 2 mg via INTRAVENOUS

## 2020-11-04 MED ORDER — DEXAMETHASONE SODIUM PHOSPHATE 4 MG/ML IJ SOLN
INTRAMUSCULAR | Status: DC | PRN
  Administered 2020-11-04: 8 mg via INTRAVENOUS

## 2020-11-04 MED ORDER — EPHEDRINE 5 MG/ML INJ
INTRAVENOUS | Status: AC
  Filled 2020-11-04: qty 10

## 2020-11-04 MED ORDER — GABAPENTIN 300 MG PO CAPS: 300.0000 mg | ORAL_CAPSULE | Freq: Three times a day (TID) | ORAL | 0 refills | Status: DC

## 2020-11-04 MED ORDER — PHENYLEPHRINE HCL (PRESSORS) 10 MG/ML IV SOLN
INTRAVENOUS | Status: DC | PRN
  Administered 2020-11-04: 80 ug via INTRAVENOUS
  Administered 2020-11-04: 120 ug via INTRAVENOUS
  Administered 2020-11-04: 80 ug via INTRAVENOUS
  Administered 2020-11-04 (×2): 40 ug via INTRAVENOUS
  Administered 2020-11-04: 80 ug via INTRAVENOUS

## 2020-11-04 MED ORDER — DEXAMETHASONE SODIUM PHOSPHATE 10 MG/ML IJ SOLN
INTRAMUSCULAR | Status: AC
  Filled 2020-11-04: qty 1

## 2020-11-04 MED ORDER — KETOROLAC TROMETHAMINE 30 MG/ML IJ SOLN
INTRAMUSCULAR | Status: AC
  Filled 2020-11-04: qty 2

## 2020-11-04 MED ORDER — FENTANYL CITRATE (PF) 100 MCG/2ML IJ SOLN
INTRAMUSCULAR | Status: AC
  Filled 2020-11-04: qty 2

## 2020-11-04 MED ORDER — SCOPOLAMINE 1 MG/3DAYS TD PT72
1.0000 | MEDICATED_PATCH | TRANSDERMAL | Status: DC
  Administered 2020-11-04: 1.5 mg via TRANSDERMAL

## 2020-11-04 MED ORDER — OXYCODONE HCL 5 MG/5ML PO SOLN
5.0000 mg | Freq: Once | ORAL | Status: DC | PRN
Start: 2020-11-04 — End: 2020-11-04

## 2020-11-04 MED ORDER — MEPERIDINE HCL 25 MG/ML IJ SOLN: 6.2500 mg | INTRAMUSCULAR | Status: DC | PRN

## 2020-11-04 MED ORDER — ONDANSETRON HCL 4 MG/2ML IJ SOLN
INTRAMUSCULAR | Status: AC
  Filled 2020-11-04: qty 2

## 2020-11-04 MED ORDER — FENTANYL CITRATE (PF) 100 MCG/2ML IJ SOLN
INTRAMUSCULAR | Status: DC | PRN
  Administered 2020-11-04: 50 ug via INTRAVENOUS

## 2020-11-04 MED ORDER — LACTATED RINGERS IV SOLN: INTRAVENOUS | Status: DC

## 2020-11-04 MED ORDER — LIDOCAINE HCL (PF) 2 % IJ SOLN
INTRAMUSCULAR | Status: AC
  Filled 2020-11-04: qty 5

## 2020-11-04 MED ORDER — PROPOFOL 10 MG/ML IV BOLUS
INTRAVENOUS | Status: DC | PRN
  Administered 2020-11-04: 100 mg via INTRAVENOUS
  Administered 2020-11-04: 200 mg via INTRAVENOUS

## 2020-11-04 MED ORDER — POVIDONE-IODINE 7.5 % EX SOLN
Freq: Once | CUTANEOUS | Status: DC
  Filled 2020-11-04: qty 118

## 2020-11-04 SURGICAL SUPPLY — 88 items
ANCH SUT 12 BICEPS BTN STRL (Orthopedic Implant) ×1 IMPLANT
APL SKNCLS STERI-STRIP NONHPOA (GAUZE/BANDAGES/DRESSINGS)
BENZOIN TINCTURE PRP APPL 2/3 (GAUZE/BANDAGES/DRESSINGS) IMPLANT
BLADE SURG 15 STRL LF DISP TIS (BLADE) ×1 IMPLANT
BLADE SURG 15 STRL SS (BLADE) ×2
BNDG CMPR 9X4 STRL LF SNTH (GAUZE/BANDAGES/DRESSINGS) ×1
BNDG COHESIVE 4X5 TAN STRL (GAUZE/BANDAGES/DRESSINGS) ×2 IMPLANT
BNDG ELASTIC 3X5.8 VLCR STR LF (GAUZE/BANDAGES/DRESSINGS) IMPLANT
BNDG ELASTIC 4X5.8 VLCR STR LF (GAUZE/BANDAGES/DRESSINGS) ×4 IMPLANT
BNDG ELASTIC 6X5.8 VLCR STR LF (GAUZE/BANDAGES/DRESSINGS) IMPLANT
BNDG ESMARK 4X9 LF (GAUZE/BANDAGES/DRESSINGS) ×2 IMPLANT
BNDG GAUZE ELAST 4 BULKY (GAUZE/BANDAGES/DRESSINGS) IMPLANT
BUTTON DISTAL BICEPS (Orthopedic Implant) ×2 IMPLANT
CANISTER SUCT 1200ML W/VALVE (MISCELLANEOUS) IMPLANT
CLEANER CAUTERY TIP 5X5 PAD (MISCELLANEOUS) IMPLANT
CORD BIPOLAR FORCEPS 12FT (ELECTRODE) ×2 IMPLANT
COVER BACK TABLE 60X90IN (DRAPES) ×2 IMPLANT
COVER MAYO STAND STRL (DRAPES) IMPLANT
CUFF TOURN SGL QUICK 18X4 (TOURNIQUET CUFF) IMPLANT
CUFF TOURN SGL QUICK 24 (TOURNIQUET CUFF)
CUFF TRNQT CYL 24X4X16.5-23 (TOURNIQUET CUFF) IMPLANT
DECANTER SPIKE VIAL GLASS SM (MISCELLANEOUS) IMPLANT
DRAPE EXTREMITY T 121X128X90 (DISPOSABLE) ×2 IMPLANT
DRAPE INCISE IOBAN 66X45 STRL (DRAPES) ×4 IMPLANT
DRAPE OEC MINIVIEW 54X84 (DRAPES) ×2 IMPLANT
DRAPE SURG 17X23 STRL (DRAPES) ×2 IMPLANT
DRAPE U 60X70 (DRAPES) ×2 IMPLANT
DRSG EMULSION OIL 3X3 NADH (GAUZE/BANDAGES/DRESSINGS) IMPLANT
DRSG PAD ABDOMINAL 8X10 ST (GAUZE/BANDAGES/DRESSINGS) ×6 IMPLANT
DRSG TEGADERM 4X4.75 (GAUZE/BANDAGES/DRESSINGS) ×4 IMPLANT
DURAPREP 26ML APPLICATOR (WOUND CARE) ×2 IMPLANT
ELECT REM PT RETURN 9FT ADLT (ELECTROSURGICAL) ×2
ELECTRODE REM PT RTRN 9FT ADLT (ELECTROSURGICAL) ×1 IMPLANT
GAUZE 4X4 16PLY ~~LOC~~+RFID DBL (SPONGE) IMPLANT
GAUZE XEROFORM 1X8 LF (GAUZE/BANDAGES/DRESSINGS) ×2 IMPLANT
GLOVE SRG 8 PF TXTR STRL LF DI (GLOVE) ×1 IMPLANT
GLOVE SURG ORTHO LTX SZ8 (GLOVE) IMPLANT
GLOVE SURG UNDER POLY LF SZ8 (GLOVE) ×2
GOWN STRL REUS W/ TWL LRG LVL3 (GOWN DISPOSABLE) ×2 IMPLANT
GOWN STRL REUS W/ TWL XL LVL3 (GOWN DISPOSABLE) ×1 IMPLANT
GOWN STRL REUS W/TWL LRG LVL3 (GOWN DISPOSABLE) ×4
GOWN STRL REUS W/TWL XL LVL3 (GOWN DISPOSABLE) ×4 IMPLANT
INSERTER BUTTON (SYSTAGENIX WOUND MANAGEMENT) ×2 IMPLANT
MANIFOLD NEPTUNE II (INSTRUMENTS) IMPLANT
NDL SUT 6 .5 CRC .975X.05 MAYO (NEEDLE) ×1 IMPLANT
NEEDLE HYPO 22GX1.5 SAFETY (NEEDLE) IMPLANT
NEEDLE MAYO 6 CRC TAPER PT (NEEDLE) ×2 IMPLANT
NEEDLE MAYO TAPER (NEEDLE) ×2
NS IRRIG 1000ML POUR BTL (IV SOLUTION) ×2 IMPLANT
PACK BASIN DAY SURGERY FS (CUSTOM PROCEDURE TRAY) ×2 IMPLANT
PAD CAST 4YDX4 CTTN HI CHSV (CAST SUPPLIES) ×3 IMPLANT
PAD CLEANER CAUTERY TIP 5X5 (MISCELLANEOUS)
PADDING CAST ABS 4INX4YD NS (CAST SUPPLIES) ×1
PADDING CAST ABS COTTON 4X4 ST (CAST SUPPLIES) ×1 IMPLANT
PADDING CAST COTTON 4X4 STRL (CAST SUPPLIES) ×6
PADDING CAST COTTON 6X4 STRL (CAST SUPPLIES) IMPLANT
PASSER SUT SWANSON 36MM LOOP (INSTRUMENTS) ×2 IMPLANT
PENCIL SMOKE EVACUATOR (MISCELLANEOUS) ×2 IMPLANT
PIN DRILL ACL TIGHTROPE 4MM (PIN) ×2 IMPLANT
SHEET MEDIUM DRAPE 40X70 STRL (DRAPES) IMPLANT
SLING ARM FOAM STRAP LRG (SOFTGOODS) IMPLANT
SLING ARM FOAM STRAP XLG (SOFTGOODS) ×2 IMPLANT
SPLINT FAST PLASTER 5X30 (CAST SUPPLIES) ×10
SPLINT PLASTER CAST FAST 5X30 (CAST SUPPLIES) ×10 IMPLANT
SPLINT PLASTER CAST XFAST 3X15 (CAST SUPPLIES) IMPLANT
SPLINT PLASTER CAST XFAST 4X15 (CAST SUPPLIES) IMPLANT
SPLINT PLASTER XTRA FAST SET 4 (CAST SUPPLIES)
SPLINT PLASTER XTRA FASTSET 3X (CAST SUPPLIES)
SPONGE T-LAP 18X18 ~~LOC~~+RFID (SPONGE) ×2 IMPLANT
STOCKINETTE 4X48 STRL (DRAPES) ×2 IMPLANT
STOCKINETTE IMPERVIOUS LG (DRAPES) ×2 IMPLANT
STRIP CLOSURE SKIN 1/2X4 (GAUZE/BANDAGES/DRESSINGS) ×2 IMPLANT
SUCTION FRAZIER HANDLE 10FR (MISCELLANEOUS)
SUCTION TUBE FRAZIER 10FR DISP (MISCELLANEOUS) IMPLANT
SUT MNCRL AB 3-0 PS2 27 (SUTURE) ×2 IMPLANT
SUT PROLENE 3 0 PS 2 (SUTURE) IMPLANT
SUT SILK 3 0 TIES 17X18 (SUTURE) ×2
SUT SILK 3-0 18XBRD TIE BLK (SUTURE) ×1 IMPLANT
SUT VIC AB 0 CT1 27 (SUTURE)
SUT VIC AB 0 CT1 27XBRD ANBCTR (SUTURE) IMPLANT
SUT VIC AB 2-0 CT1 27 (SUTURE) ×2
SUT VIC AB 2-0 CT1 TAPERPNT 27 (SUTURE) ×1 IMPLANT
SYR BULB EAR ULCER 3OZ GRN STR (SYRINGE) ×2 IMPLANT
SYR CONTROL 10ML LL (SYRINGE) IMPLANT
TOWEL GREEN STERILE FF (TOWEL DISPOSABLE) ×2 IMPLANT
TUBE CONNECTING 20X1/4 (TUBING) IMPLANT
UNDERPAD 30X36 HEAVY ABSORB (UNDERPADS AND DIAPERS) IMPLANT
YANKAUER SUCT BULB TIP NO VENT (SUCTIONS) IMPLANT

## 2020-11-04 NOTE — Anesthesia Procedure Notes (Addendum)
Procedure Name: LMA Insertion Date/Time: 11/04/2020 1:38 PM Performed by: Alford Highland, CRNA Pre-anesthesia Checklist: Patient identified, Emergency Drugs available, Suction available and Patient being monitored Patient Re-evaluated:Patient Re-evaluated prior to induction Oxygen Delivery Method: Circle System Utilized Preoxygenation: Pre-oxygenation with 100% oxygen Induction Type: IV induction Ventilation: Mask ventilation without difficulty LMA: LMA inserted LMA Size: 4.0 Number of attempts: 1 Airway Equipment and Method: Bite block Placement Confirmation: positive ETCO2 Tube secured with: Tape Dental Injury: Teeth and Oropharynx as per pre-operative assessment

## 2020-11-04 NOTE — Anesthesia Procedure Notes (Signed)
Anesthesia Regional Block: Supraclavicular block   Pre-Anesthetic Checklist: , timeout performed,  Correct Patient, Correct Site, Correct Laterality,  Correct Procedure, Correct Position, site marked,  Risks and benefits discussed,  Surgical consent,  Pre-op evaluation,  At surgeon's request and post-op pain management  Laterality: Left  Prep: Maximum Sterile Barrier Precautions used, chloraprep       Needles:  Injection technique: Single-shot  Needle Type: Echogenic Stimulator Needle     Needle Length: 9cm  Needle Gauge: 22     Additional Needles:   Procedures:,,,, ultrasound used (permanent image in chart),,    Narrative:  Start time: 11/04/2020 12:25 PM End time: 11/04/2020 12:30 PM Injection made incrementally with aspirations every 5 mL.  Performed by: Personally  Anesthesiologist: Lannie Fields, DO  Additional Notes: Monitors applied. No increased pain on injection. No increased resistance to injection. Injection made in 5cc increments. Good needle visualization. Patient tolerated procedure well.

## 2020-11-04 NOTE — Anesthesia Postprocedure Evaluation (Signed)
Anesthesia Post Note  Patient: Eric Johnston  Procedure(s) Performed: LEFT DISTAL BICEPS  REPAIR (Left: Arm Upper)     Patient location during evaluation: PACU Anesthesia Type: Regional and General Level of consciousness: awake and alert, oriented and patient cooperative Pain management: pain level controlled Vital Signs Assessment: post-procedure vital signs reviewed and stable Respiratory status: spontaneous breathing, nonlabored ventilation and respiratory function stable Cardiovascular status: blood pressure returned to baseline and stable Postop Assessment: no apparent nausea or vomiting Anesthetic complications: no   No notable events documented.  Last Vitals:  Vitals:   11/04/20 1300 11/04/20 1537  BP: (!) 141/75 126/72  Pulse: 66 77  Resp: 13 14  Temp:  36.6 C  SpO2: 99% 97%    Last Pain:  Vitals:   11/04/20 1537  TempSrc:   PainSc: Asleep                 Lannie Fields

## 2020-11-04 NOTE — H&P (Signed)
Eric Johnston is an 50 y.o. male.   Chief Complaint: left elbow pain HPI: Eric Johnston is a 50 year old patient with left elbow pain.  Injury occurred 8 days ago when he was working out.  He was seen in the urgent care and believed to have distal biceps rupture.  He is right-hand dominant.  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  Has had good result with right hip replacement and has some left hip arthritis as well.  Past Medical History:  Diagnosis Date   Angio-edema    Avascular necrosis (HCC)    RT HIP   DVT (deep venous thrombosis) (HCC)    Headache    Hypertension    WAS TAKEN OFF BP MEDS BY PCP   PONV (postoperative nausea and vomiting)     Past Surgical History:  Procedure Laterality Date   PERIDONTAL SURGERY     TOTAL HIP ARTHROPLASTY Right 06/14/2015   Procedure: RIGHT TOTAL HIP ARTHROPLASTY ANTERIOR APPROACH;  Surgeon: [name], MD;  Location: [facility];  Service: Orthopedics;  Laterality: Right;    Family History  Problem Relation Age of Onset   Hypertension Mother    Hypertension Father    Diabetes Father    Colon cancer Father    High Cholesterol Father    Social History:  reports that he has never smoked. He has never used smokeless tobacco. He reports that he does not drink alcohol and does not use drugs.  Allergies: No Known Allergies  Medications Prior to Admission  Medication Sig Dispense Refill   amLODipine (NORVASC) 10 MG tablet Take 10 mg by mouth daily.     aspirin EC 325 MG EC tablet Take 1 tablet (325 mg total) by mouth 2 (two) times daily after a meal. 30 tablet 0   COD LIVER OIL PO Take 1 capsule by mouth daily.     Multiple Vitamin (MULTIVITAMIN WITH MINERALS) TABS tablet Take 1 tablet by mouth daily.     OVER THE COUNTER MEDICATION Take 1 tablet by mouth daily. [place]- Vitamin C immune health     azithromycin (ZITHROMAX) 250 MG tablet Take first 2 tablets together, then 1 every day until finished. (Patient not taking: No sig  reported) 6 tablet 0   benzonatate (TESSALON) 100 MG capsule Take 1 capsule (100 mg total) by mouth every 8 (eight) hours. (Patient not taking: No sig reported) 21 capsule 0   meloxicam (MOBIC) 7.5 MG tablet Take 2 tablets (15 mg total) by mouth daily. (Patient not taking: No sig reported) 30 tablet 0   methocarbamol (ROBAXIN) 500 MG tablet Take 1 tablet (500 mg total) by mouth 2 (two) times daily. (Patient not taking: No sig reported) 20 tablet 0   methocarbamol (ROBAXIN) 500 MG tablet Take 1 tablet (500 mg total) by mouth every 6 (six) hours as needed for muscle spasms. (Patient not taking: Reported on 07/22/2017) 60 tablet 0   nortriptyline (PAMELOR) 25 MG capsule Take 1 capsule (25 mg total) by mouth at bedtime. (Patient not taking: Reported on 05/06/2020) 30 capsule 3   oxyCODONE-acetaminophen (PERCOCET/ROXICET) 5-325 MG tablet Take 1 tablet by mouth every 4 (four) hours as needed for severe pain. (Patient not taking: Reported on 05/06/2020) 16 tablet 0   Rivaroxaban 15 & 20 MG TBPK Take as directed on package: Start with one 15mg  tablet by mouth twice a day with food. On Day 22, switch to one 20mg  tablet once a day with food. (Patient not taking:  Reported on 05/06/2020) 51 each 0    No results found for this or any previous visit (from the past 48 hour(s)). No results found.  Review of Systems  Musculoskeletal:  Positive for arthralgias.  All other systems reviewed and are negative.  Blood pressure (!) 141/75, pulse 66, temperature 98.6 F (37 C), temperature source Oral, resp. rate 13, height 5\' 8"  (1.727 m), weight 83 kg, SpO2 99 %. Physical Exam Vitals reviewed.  HENT:     Head: Normocephalic.     Nose: Nose normal.     Mouth/Throat:     Mouth: Mucous membranes are moist.  Eyes:     Pupils: Pupils are equal, round, and reactive to light.  Cardiovascular:     Rate and Rhythm: Normal rate.     Pulses: Normal pulses.  Pulmonary:     Effort: Pulmonary effort is normal.  Abdominal:      General: Abdomen is flat.  Musculoskeletal:     Cervical back: Normal range of motion.  Skin:    General: Skin is warm.     Capillary Refill: Capillary refill takes less than 2 seconds.  Neurological:     General: No focal deficit present.     Mental Status: He is alert.  Psychiatric:        Mood and Affect: Mood normal.   Ortho exam demonstrates full active and passive range of motion of the right arm.  On the left-hand side he has a positive hook test for absence of palpable biceps tendon.  Weakness of 4- out of 5 supination strength on the left compared to the right.  There is some ecchymosis and bruising around the medial epicondyle in pronators mass.  Does have some weakness with biceps resistance strength testing is well on the left side.  Pronation supination is full.  Radial pulse is intact.  No paresthesias on the lateral forearm on the left.    Assessment/Plan  Impression is left distal biceps rupture.  Patient has no palpable tendon in the antecubital fossa along with weakness to supination bruising around the medial condyle region as well as absence of visible tendon on ultrasound examination.  Plan is distal biceps tendon rupture repair.  Risk and benefits are discussed with the patient including not limited to infection nerve vessel damage heterotopic ossification as well as limited range of motion and strength with possible need for activity modification in the future regarding his workout routine.  Patient understands risk and benefits and wishes to proceed.  Expected rehabilitation course also discussed.  All questions answered.    , MD 11/04/2020, 1:10 PM

## 2020-11-04 NOTE — Transfer of Care (Signed)
Immediate Anesthesia Transfer of Care Note  Patient: Eric Johnston  Procedure(s) Performed: LEFT DISTAL BICEPS  REPAIR (Left: Arm Upper)  Patient Location: PACU  Anesthesia Type:General  Level of Consciousness: drowsy, patient cooperative and responds to stimulation  Airway & Oxygen Therapy: Patient Spontanous Breathing and Patient connected to face mask oxygen  Post-op Assessment: Report given to RN and Post -op Vital signs reviewed and stable  Post vital signs: Reviewed and stable  Last Vitals:  Vitals Value Taken Time  BP 126/72 11/04/20 1537  Temp    Pulse 75 11/04/20 1539  Resp 12 11/04/20 1539  SpO2 97 % 11/04/20 1539  Vitals shown include unvalidated device data.  Last Pain:  Vitals:   11/04/20 0943  TempSrc: Oral  PainSc: 2          Complications: No notable events documented.

## 2020-11-04 NOTE — Discharge Instructions (Signed)

## 2020-11-04 NOTE — Progress Notes (Signed)
Assisted Dr. Finucane with left, ultrasound guided, supraclavicular block. Side rails up, monitors on throughout procedure. See vital signs in flow sheet. Tolerated Procedure well. 

## 2020-11-05 NOTE — Brief Op Note (Signed)
   11/04/2020  5:58 AM  PATIENT:  Eric Johnston  50 y.o. male  PRE-OPERATIVE DIAGNOSIS:  left distal biceps rupture  POST-OPERATIVE DIAGNOSIS:  left distal biceps rupture  PROCEDURE:  Procedure(s): LEFT DISTAL BICEPS  REPAIR  SURGEON:  Surgeon(s): August Saucer Corrie Mckusick, MD  ASSISTANT: magnant pa  ANESTHESIA:   general  EBL: 10 ml    No intake/output data recorded.  BLOOD ADMINISTERED: none  DRAINS: none   LOCAL MEDICATIONS USED:  none  SPECIMEN:  No Specimen  COUNTS:  YES  TOURNIQUET:   Total Tourniquet Time Documented: Upper Arm (Left) - 47 minutes Total: Upper Arm (Left) - 47 minutes   DICTATION: .Other Dictation: Dictation Number pending  PLAN OF CARE: Discharge to home after PACU  PATIENT DISPOSITION:  PACU - hemodynamically stable

## 2020-11-06 ENCOUNTER — Encounter (HOSPITAL_BASED_OUTPATIENT_CLINIC_OR_DEPARTMENT_OTHER): Payer: Self-pay | Admitting: Orthopedic Surgery

## 2020-11-07 NOTE — Brief Op Note (Signed)
   11/04/2020  5:35 PM  PATIENT:  Eric Johnston  50 y.o. male  PRE-OPERATIVE DIAGNOSIS:  left distal biceps rupture  POST-OPERATIVE DIAGNOSIS:  left distal biceps rupture  PROCEDURE:  Procedure(s): LEFT DISTAL BICEPS  REPAIR  SURGEON:  Surgeon(s): August Saucer Corrie Mckusick, MD  ASSISTANT: magnant pa  ANESTHESIA:   general  EBL: 15 ml    No intake/output data recorded.  BLOOD ADMINISTERED: none  DRAINS: none   LOCAL MEDICATIONS USED:  none  SPECIMEN:  No Specimen  COUNTS:  YES  TOURNIQUET:   Total Tourniquet Time Documented: Upper Arm (Left) - 47 minutes Total: Upper Arm (Left) - 47 minutes   DICTATION: .Other Dictation: Dictation Number [id number]  PLAN OF CARE: Discharge to home after PACU  PATIENT DISPOSITION:  PACU - hemodynamically stable

## 2020-11-10 DIAGNOSIS — S46212D Strain of muscle, fascia and tendon of other parts of biceps, left arm, subsequent encounter: Secondary | ICD-10-CM

## 2020-11-11 NOTE — Op Note (Signed)
NAME: Eric Johnston, Eric Johnston MEDICAL RECORD NO: [id number] ACCOUNT NO: [id number] DATE OF BIRTH: Oct 03, 1970 FACILITY: [facility] LOCATION: [facility] PHYSICIAN: Graylin Shiver. August Saucer, MD  Operative Report   DATE OF PROCEDURE: 11/04/2020  PREOPERATIVE DIAGNOSIS:  Left distal biceps rupture.  POSTOPERATIVE DIAGNOSIS:  Left distal biceps rupture.  PROCEDURE:  Left distal biceps rupture repair using Arthrex Endobutton technique.  SURGEON:  Attending, Cammy Copa, MD  ASSISTANT:  Karenann Cai, PA  INDICATIONS:  The patient is a 50 year old patient who sustained left distal biceps rupture last week, who presents for operative management after explanation of risks and benefits.  DESCRIPTION OF PROCEDURE:  The patient was brought to the operating room where general anesthetic was induced.  Preoperative antibiotics were administered.  Timeout was called.  Left arm was prescrubbed with alcohol and Betadine, allowed to air dry,  prepped with DuraPrep solution and draped in a sterile manner.  Ioban used to cover the operative field.  Elbow flexion crease was mobilized.  Timeout was called.  Under fluoroscopic guidance, the location of the radial tuberosity at the level of the  proximal forearm was marked.  An incision approximately 8 cm was made ending proximal to the elbow flexion crease.  Skin and subcutaneous tissue were sharply divided.  Within the proximal aspect of the incision, the biceps tendon torn end was localized.   It was torn and retracted but it still was somewhat adherent to the lacertus fibrosus.  These adhesions were released.  The lateral antebrachial cutaneous nerve was visualized and retracted and protected.  The end of the tendon was trimmed of  degenerative tissue.  Arthrex FiberLoop was then placed in the distal 3 cm of the tendon.  These suture ends were then placed through an Endobutton.  Next, attention was directed towards the dissection down towards the tuberosity.  The heads between  the  mobile wad and the flexor pronator mass were initially bluntly dissected.  Crossing veins and vessels were ligated with silk ligature.  Using manual dissection, the plane was fairly easily reestablished down to the tuberosity.  Recurrent leash of vessels  was also tied off in order to enhance exposure.  The tuberosity was then identified.  Using a shoulder right angle retractor on the radial side and a wide small Hohmann on the medial side, the tuberosity was visualized with the arm in maximum  supination.  The tunnel was then drilled at 7.5 mm for the tendon edge.  After drilling, thorough irrigation was performed to remove any bone.  Next, the tendon was brought down and the Endobutton was flipped and confirmed under fluoroscopic guidance.   The suture ends were then tightened, which brought approximately 12 mm to 14 mm of tendon into the created tunnel.  Next, the suture was tied to itself to lock the construct.  Overall, a stable construct was achieved.  Thorough irrigation was then  performed.  Tourniquet was released.  Bleeding points encountered were controlled using electrocautery.  Nerve was intact.  The incision was then closed using 2-0 Vicryl suture and 3-0 Monocryl.  Steri-Strips and impervious dressing applied.  A  well-padded posterior splint applied.  The patient tolerated the procedure well without immediate complications, was transferred to the recovery room in stable condition.  Luke's assistance was required for opening, closing, retraction and mobilization  of tissue.  His assistance was a medical necessity.   SHW D: 11/07/2020 5:40:46 pm T: 11/07/2020 8:34:00 pm  JOB: [id number]/ [id number]

## 2020-11-13 ENCOUNTER — Other Ambulatory Visit: Payer: Self-pay

## 2020-11-13 ENCOUNTER — Ambulatory Visit (INDEPENDENT_AMBULATORY_CARE_PROVIDER_SITE_OTHER): Payer: Commercial Managed Care - PPO | Admitting: Orthopedic Surgery

## 2020-11-13 ENCOUNTER — Encounter: Payer: Self-pay | Admitting: Orthopedic Surgery

## 2020-11-13 DIAGNOSIS — S46212A Strain of muscle, fascia and tendon of other parts of biceps, left arm, initial encounter: Secondary | ICD-10-CM

## 2020-11-13 NOTE — Progress Notes (Signed)
Post-Op Visit Note   Patient: Eric Johnston           Date of Birth: 1970-06-30           MRN: [id number] Visit Date: 11/13/2020 PCP: Tally Joe, MD   Assessment & Plan:  Chief Complaint:  Chief Complaint  Patient presents with   Other    11/04/20 left distal biceps repair   Visit Diagnoses: No diagnosis found.  Plan: Patient is a 50 year old male who presents s/p left distal biceps repair on 11/04/2020.  He is doing okay.  He is in a splint which was discontinued today.  He notes a lot of increased pain at night but overall he is tolerating pain well.  Only takes pain medicine as needed.  Which is about 2 times per day.  He does note tingling down the dorsum of the forearm into the median nerve distribution but this seems to be improving.  He did have a block at time of surgery.  Plan is to restrict patient from lifting anything with the operative arm.  He is okay to flex as much as he wants to and extend to 45 degrees.  Incision looks to be healing well without any evidence of infection or dehiscence.  Intact median, ulnar, radial nerves on motor function testing.  Follow-up in 2 weeks for clinical recheck with Dr. August Saucer.  He will continue use of his sling.  Plan to start working on full extension at that point.  Anticipate no lifting with the left upper extremity until about 6 weeks out from procedure.  Follow-Up Instructions: Return in about 2 weeks (around 11/27/2020).   Orders:  No orders of the defined types were placed in this encounter.  No orders of the defined types were placed in this encounter.   Imaging: No results found.  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    Past Medical History:  Diagnosis Date   Angio-edema    Avascular necrosis (HCC)    RT HIP   DVT (deep venous thrombosis) (HCC)    Headache    Hypertension    WAS TAKEN OFF BP MEDS BY PCP   PONV (postoperative nausea and vomiting)     Family History  Problem Relation Age of Onset   Hypertension Mother    Hypertension Father    Diabetes Father    Colon cancer Father    High Cholesterol Father     Past Surgical History:  Procedure Laterality Date   DISTAL BICEPS TENDON REPAIR Left 11/04/2020   Procedure: LEFT DISTAL BICEPS  REPAIR;  Surgeon: Cammy Copa, MD;  Location: [facility];  Service: Orthopedics;  Laterality: Left;   PERIDONTAL SURGERY     TOTAL HIP ARTHROPLASTY Right 06/14/2015   Procedure: RIGHT TOTAL HIP ARTHROPLASTY ANTERIOR APPROACH;  Surgeon: [name], MD;  Location: [facility];  Service: Orthopedics;  Laterality: Right;   Social History   Occupational History   Occupation: Retail  Tobacco Use   Smoking status: Never   Smokeless tobacco: Never  Vaping Use   Vaping Use: Never used  Substance and Sexual Activity   Alcohol use: No   Drug use: No  Sexual activity: Not on file

## 2020-11-28 ENCOUNTER — Ambulatory Visit (INDEPENDENT_AMBULATORY_CARE_PROVIDER_SITE_OTHER): Payer: Commercial Managed Care - PPO | Admitting: Orthopedic Surgery

## 2020-11-28 DIAGNOSIS — S46212A Strain of muscle, fascia and tendon of other parts of biceps, left arm, initial encounter: Secondary | ICD-10-CM

## 2020-11-29 ENCOUNTER — Encounter: Payer: Self-pay | Admitting: Orthopedic Surgery

## 2020-11-29 NOTE — Progress Notes (Signed)
   Post-Op Visit Note   Patient: Eric Johnston           Date of Birth: 1970-08-24           MRN: [id number] Visit Date: 11/28/2020 PCP: Tally Joe, MD   Assessment & Plan:  Chief Complaint:  Chief Complaint  Patient presents with   Other      11/04/20 left distal biceps repair      Visit Diagnoses: No diagnosis found.  Plan: Eric Johnston is a 50 year old patient is now 3 weeks out left distal biceps repair.  Doing well overall.  He does have a fullness in the antecubital fossa with functional biceps.  Supination is only about 10 to 15 degrees past neutral with pronation about 50 to 60 degrees past neutral.  Area of numbness in the lateral antebrachial cutaneous distribution is getting smaller.  Plan is to discontinue sling.  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  Start physical therapy for range of motion exercises focusing on supination.  Prescription provided.  Follow-up in 3 weeks and he will need x-rays at that time.  Continue with anti-inflammatories to diminish the risk of heterotopic ossification.  Follow-Up Instructions: No follow-ups on file.   Orders:  No orders of the defined types were placed in this encounter.  No orders of the defined types were placed in this encounter.   Imaging: No results found.  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   Past Medical History:  Diagnosis Date   Angio-edema    Avascular necrosis (HCC)    RT HIP   DVT (deep venous thrombosis) (HCC)    Headache    Hypertension    WAS TAKEN OFF BP MEDS BY PCP   PONV (postoperative nausea and vomiting)     Family History  Problem Relation Age of Onset    Hypertension Mother    Hypertension Father    Diabetes Father    Colon cancer Father    High Cholesterol Father     Past Surgical History:  Procedure Laterality Date   DISTAL BICEPS TENDON REPAIR Left 11/04/2020   Procedure: LEFT DISTAL BICEPS  REPAIR;  Surgeon: Cammy Copa, MD;  Location: [facility];  Service: Orthopedics;  Laterality: Left;   PERIDONTAL SURGERY     TOTAL HIP ARTHROPLASTY Right 06/14/2015   Procedure: RIGHT TOTAL HIP ARTHROPLASTY ANTERIOR APPROACH;  Surgeon: [name], MD;  Location: [facility];  Service: Orthopedics;  Laterality: Right;   Social History   Occupational History   Occupation: Retail  Tobacco Use   Smoking status: Never   Smokeless tobacco: Never  Vaping Use   Vaping Use: Never used  Substance and Sexual Activity   Alcohol use: No   Drug use: No   Sexual activity: Not on file

## 2021-12-03 ENCOUNTER — Ambulatory Visit: Payer: Self-pay

## 2021-12-03 ENCOUNTER — Ambulatory Visit
Admission: EM | Admit: 2021-12-03 | Discharge: 2021-12-03 | Disposition: A | Discharge status: Home / Self Care | Payer: Commercial Managed Care - PPO | Attending: Emergency Medicine | Admitting: Emergency Medicine

## 2021-12-03 DIAGNOSIS — R369 Urethral discharge, unspecified: Secondary | ICD-10-CM | POA: Insufficient documentation

## 2021-12-03 DIAGNOSIS — Z113 Encounter for screening for infections with a predominantly sexual mode of transmission: Secondary | ICD-10-CM | POA: Insufficient documentation

## 2021-12-03 DIAGNOSIS — R300 Dysuria: Secondary | ICD-10-CM | POA: Diagnosis present

## 2021-12-03 LAB — POCT URINALYSIS DIP (MANUAL ENTRY)
Bilirubin, UA: NEGATIVE
Glucose, UA: NEGATIVE mg/dL
Ketones, POC UA: NEGATIVE mg/dL
Leukocytes, UA: NEGATIVE
Nitrite, UA: NEGATIVE
Protein Ur, POC: NEGATIVE mg/dL
Spec Grav, UA: 1.02 (ref 1.010–1.025)
Urobilinogen, UA: 0.2 E.U./dL
pH, UA: 7 (ref 5.0–8.0)

## 2021-12-03 NOTE — ED Provider Notes (Signed)
[facility]    CSN: [id number] Arrival date & time: 12/03/21  1426    HISTORY   Chief Complaint  Patient presents with   burning with urination   HPI Eric Johnston is a pleasant, 51 y.o. male who presents to urgent care today. Patient complains of burning in his urethra when he urinates.  Patient states the pain begins when he initiates urination and continues for several minutes after he completes urination.  Patient denies difficulty initiating urine stream, dribbling, sensation of incomplete emptying, increased frequency of urination, flank pain, hematuria, erectile dysfunction, penile discharge, perineal pain, genital lesions, testicular pain or swelling, scrotal pain or swelling, prior history STD or urinary tract infections.  Patient denies known exposure to STD.  Patient reports being sexually active with his wife only.   The history is provided by the patient.   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 Authorizing Provider  amLODipine (NORVASC) 10 MG tablet Take 10 mg by mouth daily.    [provider]  aspirin EC 325 MG EC tablet Take 1 tablet (325 mg total) by mouth 2 (two) times daily after a meal. 06/16/15   Mcarthur Rossetti, MD    Family History Family History  Problem Relation Age of Onset   Hypertension Mother    Hypertension Father    Diabetes Father    Colon cancer Father    High Cholesterol Father    Social History Social History   Tobacco Use   Smoking status: Never   Smokeless tobacco: Never  Vaping Use   Vaping Use: Never used  Substance Use Topics   Alcohol use: No   Drug use: No   Allergies   Patient has no known allergies.  Review of Systems Review of Systems Pertinent findings revealed after performing a 14 point review of systems has been noted in the history of present illness.  Physical Exam Triage Vital Signs ED Triage Vitals  Enc Vitals Group     BP 01/31/21 0827 (!) 147/82     Pulse Rate 01/31/21 0827 72     Resp 01/31/21 0827 18     Temp 01/31/21 0827 98.3 F (36.8 C)     Temp Source 01/31/21 0827 Oral     SpO2 01/31/21 0827 98 %  Weight --      Height --      Head Circumference --      Peak Flow --      Pain Score 01/31/21 0826 5     Pain Loc --      Pain Edu? --      Excl. in GC? --   No data found.  Updated Vital Signs BP (!) 156/96 (BP Location: Left Arm)   Pulse 69   Temp 99.1 F (37.3 C) (Oral)   Resp 16   SpO2 97%   Physical Exam Vitals and nursing note reviewed.  Constitutional:      General: He is not in acute distress.    Appearance: Normal appearance. He is not ill-appearing.  HENT:     Head: Normocephalic and atraumatic.  Eyes:     General: Lids are normal.        Right eye: No discharge.        Left eye: No discharge.     Extraocular Movements:  Extraocular movements intact.     Conjunctiva/sclera: Conjunctivae normal.     Right eye: Right conjunctiva is not injected.     Left eye: Left conjunctiva is not injected.  Neck:     Trachea: Trachea and phonation normal.  Cardiovascular:     Rate and Rhythm: Normal rate and regular rhythm.     Pulses: Normal pulses.     Heart sounds: Normal heart sounds. No murmur heard.    No friction rub. No gallop.  Pulmonary:     Effort: Pulmonary effort is normal. No accessory muscle usage, prolonged expiration or respiratory distress.     Breath sounds: Normal breath sounds. No stridor, decreased air movement or transmitted upper airway sounds. No decreased breath sounds, wheezing, rhonchi or rales.  Chest:     Chest wall: No tenderness.  Genitourinary:    Comments: Pt politely declines GU exam, pt did provide a penile swab for testing.   Musculoskeletal:        General: Normal range of motion.     Cervical back: Normal range of motion and neck supple. Normal range of motion.  Lymphadenopathy:     Cervical: No cervical adenopathy.  Skin:    General: Skin is warm and dry.     Findings: No erythema or rash.  Neurological:     General: No focal deficit present.     Mental Status: He is alert and oriented to person, place, and time.  Psychiatric:        Mood and Affect: Mood normal.        Behavior: Behavior normal.     Visual Acuity Right Eye Distance:   Left Eye Distance:   Bilateral Distance:    Right Eye Near:   Left Eye Near:    Bilateral Near:     [facility] Couse / Diagnostics / Procedures:     Radiology No results found.  Procedures Procedures (including critical care time) EKG  Pending results:  Labs Reviewed  POCT URINALYSIS DIP (MANUAL ENTRY) - Abnormal; Notable for the following components:      Result Value   Blood, UA trace-intact (*)    All other components within normal limits  CYTOLOGY, (ORAL, ANAL, URETHRAL) ANCILLARY ONLY    Medications Ordered in  UC: Medications - No data to display  UC Diagnoses / Final Clinical Impressions(s)   I have reviewed the triage vital signs and the nursing notes.  Pertinent labs & imaging results that were  available during my care of the patient were reviewed by me and considered in my medical decision making (see chart for details).    Final diagnoses:  Screening examination for STD (sexually transmitted disease)  Abnormal penile discharge  Dysuria   Urinalysis today was unremarkable.  Urine culture will not be performed due to patient having no other signs and symptoms of urinary tract infection.  STD testing performed, will notify patient of results once received and treat as indicated based on those results.  ED Prescriptions   None    PDMP not reviewed this encounter.  Disposition Upon Discharge:  Condition: stable for discharge home  Patient presented with concern for an acute illness with associated systemic symptoms and significant discomfort requiring urgent management. In my opinion, this is a condition that a prudent lay person (someone who possesses an average knowledge of health and medicine) may potentially expect to result in complications if not addressed urgently such as respiratory distress, impairment of bodily function or dysfunction of bodily organs.   As such, the patient has been evaluated and assessed, work-up was performed and treatment was provided in alignment with urgent care protocols and evidence based medicine.  Patient/parent/caregiver has been advised that the patient may require follow up for further testing and/or treatment if the symptoms continue in spite of treatment, as clinically indicated and appropriate.  Routine symptom specific, illness specific and/or disease specific instructions were discussed with the patient and/or caregiver at length.  Prevention strategies for avoiding STD exposure were also discussed.  The patient will follow up with their current  PCP if and as advised. If the patient does not currently have a PCP we will assist them in obtaining one.   The patient may need specialty follow up if the symptoms continue, in spite of conservative treatment and management, for further workup, evaluation, consultation and treatment as clinically indicated and appropriate.  Patient/parent/caregiver verbalized understanding and agreement of plan as discussed.  All questions were addressed during visit.  Please see discharge instructions below for further details of plan.  Discharge Instructions:   Discharge Instructions      The results of your STD testing today which screens for gonorrhea, chlamydia, and trichomonas will be made available to you once it is complete.  This typically takes 3 to 5 days.  Please note that we do not test for herpes virus unless you are having an active lesion concerning for herpes outbreak.  Please abstain from sexual intercourse of any kind, vaginal, oral or anal, until you have received the results of your STD testing.    The results will initially be posted to your MyChart account and, if any of your results are abnormal, you will receive a phone call regarding treatment.  Prescriptions, if any are needed, will be provided for you at your pharmacy.    Your urinalysis today was not concerning for an acute urinary tract infection.    Thank you for visiting urgent care today.    This office note has been dictated using Teaching laboratory technician.  Unfortunately, this method of dictation can sometimes lead to typographical or grammatical errors.  I apologize for your inconvenience in advance if this occurs.  Please do not hesitate to reach out to me if clarification is needed.       Theadora Rama Scales, PA-C 12/03/21 1515

## 2021-12-03 NOTE — ED Triage Notes (Signed)
The patient states he has some penile burning sensation when he urinates.  Started: yesterday

## 2021-12-03 NOTE — Discharge Instructions (Addendum)
The results of your STD testing today which screens for gonorrhea, chlamydia, and trichomonas will be made available to you once it is complete.  This typically takes 3 to 5 days.  Please note that we do not test for herpes virus unless you are having an active lesion concerning for herpes outbreak.  Please abstain from sexual intercourse of any kind, vaginal, oral or anal, until you have received the results of your STD testing.    The results will initially be posted to your MyChart account and, if any of your results are abnormal, you will receive a phone call regarding treatment.  Prescriptions, if any are needed, will be provided for you at your pharmacy.    Your urinalysis today was not concerning for an acute urinary tract infection.    Thank you for visiting urgent care today.

## 2021-12-04 LAB — CYTOLOGY, (ORAL, ANAL, URETHRAL) ANCILLARY ONLY
Chlamydia: NEGATIVE
Comment: NEGATIVE
Comment: NEGATIVE
Comment: NORMAL
Neisseria Gonorrhea: NEGATIVE
Trichomonas: NEGATIVE

## 2022-09-16 ENCOUNTER — Ambulatory Visit (INDEPENDENT_AMBULATORY_CARE_PROVIDER_SITE_OTHER): Payer: Commercial Managed Care - PPO | Admitting: Orthopaedic Surgery

## 2022-09-16 DIAGNOSIS — M25552 Pain in left hip: Secondary | ICD-10-CM

## 2022-09-16 DIAGNOSIS — M1612 Unilateral primary osteoarthritis, left hip: Secondary | ICD-10-CM | POA: Diagnosis not present

## 2022-09-16 NOTE — Progress Notes (Signed)
The patient is actually very well-known to me.  He is only 52 years old and very athletic and we replaced his right hip successfully in March 2017.  He has been dealing with left hip pain has been slowly getting worse over the last 3 years.  The last 12 months it has been getting more significantly worse.  He does have x-rays from 2022 showing bone-on-bone wear of his left hip.  He says his right hip is doing well.  I did review those x-rays from 2022 and it shows significant loss of joint space with narrowing and osteophytes around the hip as well as sclerotic changes.  It is definitely bone-on-bone wear at this point.  It is definitely affecting his mobility, his quality of life and his actives daily living.    On exam his left hip is significantly stiff with internal and external rotation with significant pain in the groin.  This is causing him to walk with a limp.  His right hip has much better motion.  We had a long and thorough discussion about hip replacement surgery and trying to get him on the schedule.  He was needing to get this done soon based on his work schedule and needing off her medical leave for the surgery.  I apologize for the inability to get him on sooner or get him in the office sooner.  I did give him our surgery scheduler's card and will fill out a surgery schedule sheet to get him scheduled hopefully in the future for a left hip replacement.

## 2022-09-25 ENCOUNTER — Telehealth: Payer: Self-pay

## 2022-09-25 NOTE — Telephone Encounter (Signed)
I called patient and left voice mail to return my call to discuss scheduling surgery.

## 2022-10-01 ENCOUNTER — Telehealth: Payer: Self-pay

## 2022-10-01 NOTE — Telephone Encounter (Signed)
Patient left me a voice mail returning my call.  I called patient back and left a voice mail to call me back regarding scheduling surgery.

## 2022-10-26 ENCOUNTER — Encounter (HOSPITAL_COMMUNITY): Payer: Self-pay

## 2022-10-26 NOTE — Progress Notes (Signed)
PCP - Tally Joe, MD Cardiologist - no  PPM/ICD -  Device Orders -  Rep Notified -   Chest x-ray -  EKG -  Stress Test -  ECHO -  Cardiac Cath -   Sleep Study -  CPAP -   Fasting Blood Sugar -  Checks Blood Sugar _____ times a day  Blood Thinner Instructions: Aspirin Instructions:  ERAS Protcol - PRE-SURGERY Ensure    COVID vaccine -yes  Activity--Able to exercise without symptoms   Anesthesia review: BP elevated at preop Christeen Douglas , PA-C aware pt. Advised to monitor at home and follow up with PCP if still elevated .BP came down to 140/ 99 taken after lab/EKG  Patient denies shortness of breath, fever, cough and chest pain at PAT appointment   All instructions explained to the patient, with a verbal understanding of the material. Patient agrees to go over the instructions while at home for a better understanding. Patient also instructed to self quarantine after being tested for COVID-19. The opportunity to ask questions was provided.

## 2022-10-26 NOTE — Patient Instructions (Signed)
SURGICAL WAITING ROOM VISITATION  Patients having surgery or a procedure may have no more than 2 support people in the waiting area - these visitors may rotate.    Children under the age of 80 must have an adult with them who is not the patient.   If the patient needs to stay at the hospital during part of their recovery, the visitor guidelines for inpatient rooms apply. Pre-op nurse will coordinate an appropriate time for 1 support person to accompany patient in pre-op.  This support person may not rotate.    Please refer to the [facility] website for the visitor guidelines for Inpatients (after your surgery is over and you are in a regular room).       Your procedure is scheduled on: 11-06-22    Report to [facility] Main Entrance    Report to admitting at         0600  AM   Call this number if you have problems the morning of surgery [phone]   Do not eat food :After Midnight.   After Midnight you may have the following liquids until __0530 ____ AM/ DAY OF SURGERY   then nothing by mouth  Water Non-Citrus Juices (without pulp, NO RED-Apple, White grape, White cranberry) Black Coffee (NO MILK/CREAM OR CREAMERS, sugar ok)  Clear Tea (NO MILK/CREAM OR CREAMERS, sugar ok) regular and decaf                             Plain Jell-O (NO RED)                                           Fruit ices (not with fruit pulp, NO RED)                                     Popsicles (NO RED)                                                               Sports drinks like Gatorade (NO RED)                  The day of surgery:  Drink ONE (1) Pre-Surgery Clear Ensure  at      0515 AM the morning of surgery. Drink in one sitting. Do not sip.  This drink was given to you during your hospital  pre-op appointment visit. Nothing else to drink after completing the  Pre-Surgery Clear Ensure by 0530 .          If you have questions, please contact your surgeon's office.   FOLLOW ANY  ADDITIONAL PRE OP INSTRUCTIONS YOU RECEIVED FROM YOUR SURGEON'S OFFICE!!!     Oral Hygiene is also important to reduce your risk of infection.                                    Remember - BRUSH YOUR TEETH THE MORNING OF SURGERY WITH YOUR REGULAR TOOTHPASTE  DENTURES WILL BE REMOVED PRIOR TO SURGERY PLEASE DO NOT APPLY "Poly grip" OR ADHESIVES!!!   Do NOT smoke after Midnight   Take these medicines the morning of surgery with A SIP OF WATER: amlodipine, tylenol if needed                                  You may not have any metal on your body including hair pins, jewelry, and body piercing             Do not wear  lotions, powders, perfumes/cologne, or deodorant                Men may shave face and neck.   Do not bring valuables to the hospital. [place] IS NOT             RESPONSIBLE   FOR VALUABLES.   Contacts, glasses, dentures or bridgework may not be worn into surgery.   Bring small overnight bag day of surgery.   DO NOT BRING YOUR HOME MEDICATIONS TO THE HOSPITAL. PHARMACY WILL DISPENSE MEDICATIONS LISTED ON YOUR MEDICATION LIST TO YOU DURING YOUR ADMISSION IN THE HOSPITAL!    Patients discharged on the day of surgery will not be allowed to drive home.  Someone NEEDS to stay with you for the first 24 hours after anesthesia.   Special Instructions: Bring a copy of your healthcare power of attorney and living will documents the day of surgery if you haven't scanned them before.              Please read over the following fact sheets you were given: IF YOU HAVE QUESTIONS ABOUT YOUR PRE-OP INSTRUCTIONS PLEASE CALL [phone]    If you test positive for Covid or have been in contact with anyone that has tested positive in the last 10 days please notify you surgeon.      Pre-operative 5 CHG Bath Instructions   You can play a key role in reducing the risk of infection after surgery. Your skin needs to be as free of germs as possible. You can reduce the number of  germs on your skin by washing with CHG (chlorhexidine gluconate) soap before surgery. CHG is an antiseptic soap that kills germs and continues to kill germs even after washing.   DO NOT use if you have an allergy to chlorhexidine/CHG or antibacterial soaps. If your skin becomes reddened or irritated, stop using the CHG and notify one of our RNs at [phone].   Please shower with the CHG soap starting 4 days before surgery using the following schedule:     Please keep in mind the following:  DO NOT shave, including legs and underarms, starting the day of your first shower.   You may shave your face at any point before/day of surgery.  Place clean sheets on your bed the day you start using CHG soap. Use a clean washcloth (not used since being washed) for each shower. DO NOT sleep with pets once you start using the CHG.   CHG Shower Instructions:  If you choose to wash your hair and private area, wash first with your normal shampoo/soap.  After you use shampoo/soap, rinse your hair and body thoroughly to remove shampoo/soap residue.  Turn the water OFF and apply about 3 tablespoons (45 ml) of CHG soap to a CLEAN washcloth.  Apply CHG soap ONLY FROM YOUR NECK DOWN TO YOUR TOES (washing  for 3-5 minutes)  DO NOT use CHG soap on face, private areas, open wounds, or sores.  Pay special attention to the area where your surgery is being performed.  If you are having back surgery, having someone wash your back for you may be helpful. Wait 2 minutes after CHG soap is applied, then you may rinse off the CHG soap.  Pat dry with a clean towel  Put on clean clothes/pajamas   If you choose to wear lotion, please use ONLY the CHG-compatible lotions on the back of this paper.     Additional instructions for the day of surgery: DO NOT APPLY any lotions, deodorants, cologne, or perfumes.   Put on clean/comfortable clothes.  Brush your teeth.  Ask your nurse before applying any prescription medications  to the skin.      CHG Compatible Lotions   Aveeno Moisturizing lotion  Cetaphil Moisturizing Cream  Cetaphil Moisturizing Lotion  Clairol Herbal Essence Moisturizing Lotion, Dry Skin  Clairol Herbal Essence Moisturizing Lotion, Extra Dry Skin  Clairol Herbal Essence Moisturizing Lotion, Normal Skin  Curel Age Defying Therapeutic Moisturizing Lotion with Alpha Hydroxy  Curel Extreme Care Body Lotion  Curel Soothing Hands Moisturizing Hand Lotion  Curel Therapeutic Moisturizing Cream, Fragrance-Free  Curel Therapeutic Moisturizing Lotion, Fragrance-Free  Curel Therapeutic Moisturizing Lotion, Original Formula  Eucerin Daily Replenishing Lotion  Eucerin Dry Skin Therapy Plus Alpha Hydroxy Crme  Eucerin Dry Skin Therapy Plus Alpha Hydroxy Lotion  Eucerin Original Crme  Eucerin Original Lotion  Eucerin Plus Crme Eucerin Plus Lotion  Eucerin TriLipid Replenishing Lotion  Keri Anti-Bacterial Hand Lotion  Keri Deep Conditioning Original Lotion Dry Skin Formula Softly Scented  Keri Deep Conditioning Original Lotion, Fragrance Free Sensitive Skin Formula  Keri Lotion Fast Absorbing Fragrance Free Sensitive Skin Formula  Keri Lotion Fast Absorbing Softly Scented Dry Skin Formula  Keri Original Lotion  Keri Skin Renewal Lotion Keri Silky Smooth Lotion  Keri Silky Smooth Sensitive Skin Lotion  Nivea Body Creamy Conditioning Oil  Nivea Body Extra Enriched Teacher, adult education Moisturizing Lotion Nivea Crme  Nivea Skin Firming Lotion  NutraDerm 30 Skin Lotion  NutraDerm Skin Lotion  NutraDerm Therapeutic Skin Cream  NutraDerm Therapeutic Skin Lotion  ProShield Protective Hand Cream  Provon moisturizing lotion   WHAT IS A BLOOD TRANSFUSION? Blood Transfusion Information  A transfusion is the replacement of blood or some of its parts. Blood is made up of multiple cells which provide different functions. Red blood cells carry oxygen and are used for  blood loss replacement. White blood cells fight against infection. Platelets control bleeding. Plasma helps clot blood. Other blood products are available for specialized needs, such as hemophilia or other clotting disorders. BEFORE THE TRANSFUSION  Who gives blood for transfusions?  Healthy volunteers who are fully evaluated to make sure their blood is safe. This is blood bank blood. Transfusion therapy is the safest it has ever been in the practice of medicine. Before blood is taken from a donor, a complete history is taken to make sure that person has no history of diseases nor engages in risky social behavior (examples are intravenous drug use or sexual activity with multiple partners). The donor's travel history is screened to minimize risk of transmitting infections, such as malaria. The donated blood is tested for signs of infectious diseases, such as HIV and hepatitis. The blood is then tested to be sure it is compatible with you in order to minimize the chance of  a transfusion reaction. If you or a relative donates blood, this is often done in anticipation of surgery and is not appropriate for emergency situations. It takes many days to process the donated blood. RISKS AND COMPLICATIONS Although transfusion therapy is very safe and saves many lives, the main dangers of transfusion include:  Getting an infectious disease. Developing a transfusion reaction. This is an allergic reaction to something in the blood you were given. Every precaution is taken to prevent this. The decision to have a blood transfusion has been considered carefully by your caregiver before blood is given. Blood is not given unless the benefits outweigh the risks. AFTER THE TRANSFUSION Right after receiving a blood transfusion, you will usually feel much better and more energetic. This is especially true if your red blood cells have gotten low (anemic). The transfusion raises the level of the red blood cells which carry  oxygen, and this usually causes an energy increase. The nurse administering the transfusion will monitor you carefully for complications. HOME CARE INSTRUCTIONS  No special instructions are needed after a transfusion. You may find your energy is better. Speak with your caregiver about any limitations on activity for underlying diseases you may have. SEEK MEDICAL CARE IF:  Your condition is not improving after your transfusion. You develop redness or irritation at the intravenous (IV) site. SEEK IMMEDIATE MEDICAL CARE IF:  Any of the following symptoms occur over the next 12 hours: Shaking chills. You have a temperature by mouth above 102 F (38.9 C), not controlled by medicine. Chest, back, or muscle pain. People around you feel you are not acting correctly or are confused. Shortness of breath or difficulty breathing. Dizziness and fainting. You get a rash or develop hives. You have a decrease in urine output. Your urine turns a dark color or changes to pink, red, or brown. Any of the following symptoms occur over the next 10 days: You have a temperature by mouth above 102 F (38.9 C), not controlled by medicine. Shortness of breath. Weakness after normal activity. The white part of the eye turns yellow (jaundice). You have a decrease in the amount of urine or are urinating less often. Your urine turns a dark color or changes to pink, red, or brown. Document Released: 03/20/2000 Document Revised: 06/15/2011 Document Reviewed: 11/07/2007 Exit[employer] Patient Information 2014 [place], [place].  _______________________________________________________________________  Incentive Spirometer  An incentive spirometer is a tool that can help keep your lungs clear and active. This tool measures how well you are filling your lungs with each breath. Taking long deep breaths may help reverse or decrease the chance of developing breathing (pulmonary) problems (especially infection) following: A long  period of time when you are unable to move or be active. BEFORE THE PROCEDURE  If the spirometer includes an indicator to show your best effort, your nurse or respiratory therapist will set it to a desired goal. If possible, sit up straight or lean slightly forward. Try not to slouch. Hold the incentive spirometer in an upright position. INSTRUCTIONS FOR USE  Sit on the edge of your bed if possible, or sit up as far as you can in bed or on a chair. Hold the incentive spirometer in an upright position. Breathe out normally. Place the mouthpiece in your mouth and seal your lips tightly around it. Breathe in slowly and as deeply as possible, raising the piston or the ball toward the top of the column. Hold your breath for 3-5 seconds or for as long as possible.  Allow the piston or ball to fall to the bottom of the column. Remove the mouthpiece from your mouth and breathe out normally. Rest for a few seconds and repeat Steps 1 through 7 at least 10 times every 1-2 hours when you are awake. Take your time and take a few normal breaths between deep breaths. The spirometer may include an indicator to show your best effort. Use the indicator as a goal to work toward during each repetition. After each set of 10 deep breaths, practice coughing to be sure your lungs are clear. If you have an incision (the cut made at the time of surgery), support your incision when coughing by placing a pillow or rolled up towels firmly against it. Once you are able to get out of bed, walk around indoors and cough well. You may stop using the incentive spirometer when instructed by your caregiver.  RISKS AND COMPLICATIONS Take your time so you do not get dizzy or light-headed. If you are in pain, you may need to take or ask for pain medication before doing incentive spirometry. It is harder to take a deep breath if you are having pain. AFTER USE Rest and breathe slowly and easily. It can be helpful to keep track of a log  of your progress. Your caregiver can provide you with a simple table to help with this. If you are using the spirometer at home, follow these instructions: SEEK MEDICAL CARE IF:  You are having difficultly using the spirometer. You have trouble using the spirometer as often as instructed. Your pain medication is not giving enough relief while using the spirometer. You develop fever of 100.5 F (38.1 C) or higher. SEEK IMMEDIATE MEDICAL CARE IF:  You cough up bloody sputum that had not been present before. You develop fever of 102 F (38.9 C) or greater. You develop worsening pain at or near the incision site. MAKE SURE YOU:  Understand these instructions. Will watch your condition. Will get help right away if you are not doing well or get worse. Document Released: 08/03/2006 Document Revised: 06/15/2011 Document Reviewed: 10/04/2006 [facility] Patient Information 2014 [place], [place].   ________________________________________________________________________

## 2022-10-28 ENCOUNTER — Other Ambulatory Visit: Payer: Self-pay

## 2022-10-28 ENCOUNTER — Encounter (HOSPITAL_COMMUNITY): Payer: Self-pay

## 2022-10-28 ENCOUNTER — Encounter (HOSPITAL_COMMUNITY)
Admission: RE | Admit: 2022-10-28 | Discharge: 2022-10-28 | Disposition: A | Discharge status: Home / Self Care | Payer: Commercial Managed Care - PPO | Source: Ambulatory Visit | Attending: Orthopaedic Surgery | Admitting: Orthopaedic Surgery

## 2022-10-28 VITALS — BP 140/99 | HR 72 | Temp 99.2°F | Resp 16 | Ht 68.0 in | Wt 180.0 lb

## 2022-10-28 DIAGNOSIS — M1612 Unilateral primary osteoarthritis, left hip: Secondary | ICD-10-CM | POA: Diagnosis not present

## 2022-10-28 DIAGNOSIS — Z01818 Encounter for other preprocedural examination: Secondary | ICD-10-CM | POA: Insufficient documentation

## 2022-10-28 DIAGNOSIS — I10 Essential (primary) hypertension: Secondary | ICD-10-CM | POA: Diagnosis not present

## 2022-10-28 HISTORY — DX: Anxiety disorder, unspecified: F41.9

## 2022-10-28 HISTORY — DX: Prediabetes: R73.03

## 2022-10-28 LAB — COMPREHENSIVE METABOLIC PANEL
ALT: 21 U/L (ref 0–44)
AST: 10 U/L — ABNORMAL LOW (ref 15–41)
Albumin: 3.8 g/dL (ref 3.5–5.0)
Alkaline Phosphatase: 69 U/L (ref 38–126)
Anion gap: 7 (ref 5–15)
BUN: 16 mg/dL (ref 6–20)
CO2: 26 mmol/L (ref 22–32)
Calcium: 9 mg/dL (ref 8.9–10.3)
Chloride: 104 mmol/L (ref 98–111)
Creatinine, Ser: 0.88 mg/dL (ref 0.61–1.24)
GFR, Estimated: >60 mL/min (ref >60)
Glucose, Bld: 89 mg/dL (ref 70–99)
Potassium: 4 mmol/L (ref 3.5–5.1)
Sodium: 137 mmol/L (ref 135–145)
Total Bilirubin: 0.5 mg/dL (ref 0.3–1.2)
Total Protein: 7.1 g/dL (ref 6.5–8.1)

## 2022-10-28 LAB — CBC
HCT: 44.6 % (ref 39.0–52.0)
Hemoglobin: 13.2 g/dL (ref 13.0–17.0)
MCH: 24.4 pg — ABNORMAL LOW (ref 26.0–34.0)
MCHC: 29.6 g/dL — ABNORMAL LOW (ref 30.0–36.0)
MCV: 82.4 fL (ref 80.0–100.0)
Platelets: 181 10*3/uL (ref 150–400)
RBC: 5.41 MIL/uL (ref 4.22–5.81)
RDW: 13.5 % (ref 11.5–15.5)
WBC: 4.1 10*3/uL (ref 4.0–10.5)
nRBC: 0 % (ref 0.0–0.2)

## 2022-10-28 LAB — TYPE AND SCREEN
ABO/RH(D): O POS
Antibody Screen: NEGATIVE

## 2022-10-28 LAB — SURGICAL PCR SCREEN
MRSA, PCR: NEGATIVE
Staphylococcus aureus: NEGATIVE

## 2022-11-05 ENCOUNTER — Other Ambulatory Visit: Payer: Self-pay

## 2022-11-05 NOTE — H&P (Signed)
TOTAL HIP ADMISSION H&P  Patient is admitted for left total hip arthroplasty.  Subjective:  Chief Complaint: left hip pain  HPI: Eric Johnston, 52 y.o. male, has a history of pain and functional disability in the left hip(s) due to arthritis and patient has failed non-surgical conservative treatments for greater than 12 weeks to include NSAID's and/or analgesics, corticosteriod injections, flexibility and strengthening excercises, and activity modification.  Onset of symptoms was gradual starting 3 years ago with gradually worsening course since that time.The patient noted no past surgery on the left hip(s).  Patient currently rates pain in the left hip at 10 out of 10 with activity. Patient has night pain, worsening of pain with activity and weight bearing, pain that interfers with activities of daily living, and pain with passive range of motion. Patient has evidence of subchondral sclerosis, periarticular osteophytes, and joint space narrowing by imaging studies. This condition presents safety issues increasing the risk of falls.  There is no current active infection.  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   Past Medical History:  Diagnosis Date   Angio-edema    Anxiety    situational   Avascular necrosis (HCC)    RT HIP   DVT (deep venous thrombosis) (HCC)    Headache    Hypertension    WAS TAKEN OFF BP MEDS BY PCP   PONV (postoperative nausea and vomiting)    Pre-diabetes     Past Surgical History:  Procedure Laterality Date   DISTAL BICEPS TENDON REPAIR Left 11/04/2020   Procedure: LEFT DISTAL BICEPS  REPAIR;  Surgeon: Cammy Copa, MD;  Location: [facility];  Service:  Orthopedics;  Laterality: Left;   PERIDONTAL SURGERY     TOTAL HIP ARTHROPLASTY Right 06/14/2015   Procedure: RIGHT TOTAL HIP ARTHROPLASTY ANTERIOR APPROACH;  Surgeon: [name], MD;  Location: [facility];  Service: Orthopedics;  Laterality: Right;    No current facility-administered medications for this encounter.   Current Outpatient Medications  Medication Sig Dispense Refill Last Dose   acetaminophen (TYLENOL) 500 MG tablet Take 1,000-1,500 mg by mouth every 6 (six) hours as needed (pain.).      amLODipine (NORVASC) 10 MG tablet Take 10 mg by mouth every evening.      ibuprofen (ADVIL) 200 MG tablet Take 400-600 mg by mouth every 8 (eight) hours as needed (pain.).      Multiple Vitamins-Minerals (ADULT ONE DAILY GUMMIES PO) Take 1 tablet by mouth in the morning. VitaFusion      Multiple Vitamins-Minerals (AIRBORNE PO) Take 1 tablet by mouth in the morning and at bedtime.      naproxen sodium (ALEVE) 220 MG tablet Take 440-660 mg by mouth 2 (two) times daily as needed (pain.).      Allergies  Allergen Reactions   Shellfish Allergy Nausea And Vomiting    Social History   Tobacco Use   Smoking status: Never   Smokeless tobacco: Never  Substance Use Topics   Alcohol use: No    Family History  Problem Relation Age of Onset   Hypertension Mother    Hypertension Father    Diabetes Father    Colon cancer Father    High Cholesterol Father  Review of Systems  Objective:  Physical Exam Vitals reviewed.  Constitutional:      Appearance: Normal appearance. He is normal weight.  HENT:     Head: Normocephalic and atraumatic.  Eyes:     Extraocular Movements: Extraocular movements intact.     Pupils: Pupils are equal, round, and reactive to light.  Cardiovascular:     Rate and Rhythm: Normal rate and regular rhythm.     Pulses: Normal pulses.  Pulmonary:     Effort: Pulmonary effort is normal.     Breath sounds: Normal breath sounds.  Abdominal:      Palpations: Abdomen is soft.  Musculoskeletal:     Cervical back: Normal range of motion and neck supple.     Left hip: Tenderness and bony tenderness present. Decreased range of motion. Decreased strength.  Neurological:     Mental Status: He is alert and oriented to person, place, and time.  Psychiatric:        Behavior: Behavior normal.     Vital signs in last 24 hours:    Labs:   Estimated body mass index is 27.37 kg/m as calculated from the following:   Height as of 10/28/22: 5\' 8"  (1.727 m).   Weight as of 10/28/22: 81.6 kg.   Imaging Review Plain radiographs demonstrate severe degenerative joint disease of the left hip(s). The bone quality appears to be excellent for age and reported activity level.      Assessment/Plan:  End stage arthritis, left hip(s)  The patient history, physical examination, clinical judgement of the provider and imaging studies are consistent with end stage degenerative joint disease of the left hip(s) and total hip arthroplasty is deemed medically necessary. The treatment options including medical management, injection therapy, arthroscopy and arthroplasty were discussed at length. The risks and benefits of total hip arthroplasty were presented and reviewed. The risks due to aseptic loosening, infection, stiffness, dislocation/subluxation,  thromboembolic complications and other imponderables were discussed.  The patient acknowledged the explanation, agreed to proceed with the plan and consent was signed. Patient is being admitted for inpatient treatment for surgery, pain control, PT, OT, prophylactic antibiotics, VTE prophylaxis, progressive ambulation and ADL's and discharge planning.The patient is planning to be discharged home with home health services

## 2022-11-05 NOTE — Anesthesia Preprocedure Evaluation (Addendum)
Anesthesia Evaluation  Patient identified by MRN, date of birth, ID band Patient awake    Reviewed: Allergy & Precautions, NPO status , Patient's Chart, lab work & pertinent test results  History of Anesthesia Complications (+) PONV  Airway Mallampati: II  TM Distance: >3 FB Neck ROM: Full    Dental  (+) Dental Advisory Given   Pulmonary neg pulmonary ROS   breath sounds clear to auscultation       Cardiovascular hypertension, Pt. on medications (-) angina + DVT   Rhythm:Regular Rate:Normal     Neuro/Psych  Headaches  Anxiety        GI/Hepatic negative GI ROS, Neg liver ROS,,,  Endo/Other  negative endocrine ROS    Renal/GU negative Renal ROS     Musculoskeletal  (+) Arthritis , Osteoarthritis,    Abdominal   Peds  Hematology negative hematology ROS (+)   Anesthesia Other Findings   Reproductive/Obstetrics                             Anesthesia Physical Anesthesia Plan  ASA: 2  Anesthesia Plan: Spinal   Post-op Pain Management: Tylenol PO (pre-op)*   Induction:   PONV Risk Score and Plan: 2 and Ondansetron and Treatment may vary due to age or medical condition  Airway Management Planned: Natural Airway and Simple Face Mask  Additional Equipment: None  Intra-op Plan:   Post-operative Plan:   Informed Consent: I have reviewed the patients History and Physical, chart, labs and discussed the procedure including the risks, benefits and alternatives for the proposed anesthesia with the patient or authorized representative who has indicated his/her understanding and acceptance.     Dental advisory given  Plan Discussed with: CRNA and Surgeon  Anesthesia Plan Comments:         Anesthesia Quick Evaluation

## 2022-11-06 ENCOUNTER — Ambulatory Visit (HOSPITAL_COMMUNITY): Payer: Commercial Managed Care - PPO | Admitting: Anesthesiology

## 2022-11-06 ENCOUNTER — Encounter (HOSPITAL_COMMUNITY): Payer: Self-pay | Admitting: Orthopaedic Surgery

## 2022-11-06 ENCOUNTER — Ambulatory Visit (HOSPITAL_COMMUNITY): Payer: Commercial Managed Care - PPO

## 2022-11-06 ENCOUNTER — Other Ambulatory Visit: Payer: Self-pay

## 2022-11-06 ENCOUNTER — Observation Stay (HOSPITAL_COMMUNITY): Payer: Commercial Managed Care - PPO

## 2022-11-06 ENCOUNTER — Observation Stay (HOSPITAL_COMMUNITY)
Admission: RE | Admit: 2022-11-06 | Discharge: 2022-11-08 | Disposition: A | Discharge status: Home / Self Care | Payer: Commercial Managed Care - PPO | Source: Ambulatory Visit | Attending: Orthopaedic Surgery | Admitting: Orthopaedic Surgery

## 2022-11-06 ENCOUNTER — Encounter (HOSPITAL_COMMUNITY)
Admission: RE | Disposition: A | Discharge status: Home / Self Care | Payer: Self-pay | Source: Ambulatory Visit | Attending: Orthopaedic Surgery

## 2022-11-06 DIAGNOSIS — Z96642 Presence of left artificial hip joint: Secondary | ICD-10-CM

## 2022-11-06 DIAGNOSIS — Z79899 Other long term (current) drug therapy: Secondary | ICD-10-CM | POA: Diagnosis not present

## 2022-11-06 DIAGNOSIS — I10 Essential (primary) hypertension: Secondary | ICD-10-CM | POA: Diagnosis not present

## 2022-11-06 DIAGNOSIS — Z86718 Personal history of other venous thrombosis and embolism: Secondary | ICD-10-CM | POA: Insufficient documentation

## 2022-11-06 DIAGNOSIS — M1612 Unilateral primary osteoarthritis, left hip: Principal | ICD-10-CM | POA: Diagnosis present

## 2022-11-06 DIAGNOSIS — Z96641 Presence of right artificial hip joint: Secondary | ICD-10-CM | POA: Insufficient documentation

## 2022-11-06 HISTORY — PX: TOTAL HIP ARTHROPLASTY: SHX124

## 2022-11-06 SURGERY — ARTHROPLASTY, HIP, TOTAL, ANTERIOR APPROACH
Anesthesia: Spinal | Site: Hip | Laterality: Left

## 2022-11-06 MED ORDER — DEXAMETHASONE SODIUM PHOSPHATE 10 MG/ML IJ SOLN
INTRAMUSCULAR | Status: DC | PRN
Start: 2022-11-06 — End: 2022-11-06
  Administered 2022-11-06: 10 mg via INTRAVENOUS

## 2022-11-06 MED ORDER — DEXAMETHASONE SODIUM PHOSPHATE 10 MG/ML IJ SOLN
INTRAMUSCULAR | Status: AC
  Filled 2022-11-06: qty 1

## 2022-11-06 MED ORDER — BUPIVACAINE IN DEXTROSE 0.75-8.25 % IT SOLN
INTRATHECAL | Status: DC | PRN
  Administered 2022-11-06: 12 mg via INTRATHECAL

## 2022-11-06 MED ORDER — MORPHINE SULFATE (PF) 2 MG/ML IV SOLN: 2.0000 mg | INTRAVENOUS | Status: DC | PRN

## 2022-11-06 MED ORDER — 0.9 % SODIUM CHLORIDE (POUR BTL) OPTIME
TOPICAL | Status: DC | PRN
  Administered 2022-11-06: 1000 mL

## 2022-11-06 MED ORDER — ONDANSETRON HCL 4 MG/2ML IJ SOLN
INTRAMUSCULAR | Status: DC | PRN
  Administered 2022-11-06: 4 mg via INTRAVENOUS

## 2022-11-06 MED ORDER — PROPOFOL 500 MG/50ML IV EMUL
INTRAVENOUS | Status: DC | PRN
  Administered 2022-11-06: 40 ug/kg/min via INTRAVENOUS

## 2022-11-06 MED ORDER — TAMSULOSIN HCL 0.4 MG PO CAPS
0.4000 mg | ORAL_CAPSULE | Freq: Every day | ORAL | Status: DC
  Administered 2022-11-06 – 2022-11-08 (×3): 0.4 mg via ORAL
  Filled 2022-11-06 (×3): qty 1

## 2022-11-06 MED ORDER — OXYCODONE HCL 5 MG PO TABS: 5.0000 mg | ORAL_TABLET | ORAL | Status: DC | PRN

## 2022-11-06 MED ORDER — CEFAZOLIN SODIUM-DEXTROSE 2-4 GM/100ML-% IV SOLN
2.0000 g | INTRAVENOUS | Status: AC
  Administered 2022-11-06: 2 g via INTRAVENOUS
  Filled 2022-11-06: qty 100

## 2022-11-06 MED ORDER — ONDANSETRON HCL 4 MG/2ML IJ SOLN: 4.0000 mg | Freq: Four times a day (QID) | INTRAMUSCULAR | Status: DC | PRN

## 2022-11-06 MED ORDER — FENTANYL CITRATE (PF) 100 MCG/2ML IJ SOLN
INTRAMUSCULAR | Status: AC
  Filled 2022-11-06: qty 2

## 2022-11-06 MED ORDER — METOCLOPRAMIDE HCL 5 MG PO TABS: 5.0000 mg | ORAL_TABLET | Freq: Three times a day (TID) | ORAL | Status: DC | PRN

## 2022-11-06 MED ORDER — AMISULPRIDE (ANTIEMETIC) 5 MG/2ML IV SOLN
INTRAVENOUS | Status: AC
  Filled 2022-11-06: qty 4

## 2022-11-06 MED ORDER — PROPOFOL 1000 MG/100ML IV EMUL
INTRAVENOUS | Status: AC
  Filled 2022-11-06: qty 100

## 2022-11-06 MED ORDER — SODIUM CHLORIDE 0.9 % IR SOLN
Status: DC | PRN
  Administered 2022-11-06: 1000 mL

## 2022-11-06 MED ORDER — PANTOPRAZOLE SODIUM 40 MG PO TBEC
40.0000 mg | DELAYED_RELEASE_TABLET | Freq: Every day | ORAL | Status: DC
  Administered 2022-11-07 – 2022-11-08 (×2): 40 mg via ORAL
  Filled 2022-11-06 (×2): qty 1

## 2022-11-06 MED ORDER — OXYCODONE HCL 5 MG PO TABS
ORAL_TABLET | ORAL | Status: AC
  Filled 2022-11-06: qty 2

## 2022-11-06 MED ORDER — METOCLOPRAMIDE HCL 5 MG/ML IJ SOLN
5.0000 mg | Freq: Three times a day (TID) | INTRAMUSCULAR | Status: DC | PRN
  Administered 2022-11-06: 10 mg via INTRAVENOUS
  Filled 2022-11-06: qty 2

## 2022-11-06 MED ORDER — HYDROMORPHONE HCL 1 MG/ML IJ SOLN
0.2500 mg | INTRAMUSCULAR | Status: DC | PRN
  Administered 2022-11-06 (×3): 0.5 mg via INTRAVENOUS

## 2022-11-06 MED ORDER — OXYCODONE HCL 5 MG PO TABS: 5.0000 mg | ORAL_TABLET | Freq: Once | ORAL | Status: DC | PRN

## 2022-11-06 MED ORDER — CEFAZOLIN SODIUM-DEXTROSE 1-4 GM/50ML-% IV SOLN
1.0000 g | Freq: Four times a day (QID) | INTRAVENOUS | Status: AC
  Administered 2022-11-06 (×2): 1 g via INTRAVENOUS
  Filled 2022-11-06 (×2): qty 50

## 2022-11-06 MED ORDER — ACETAMINOPHEN 325 MG PO TABS
325.0000 mg | ORAL_TABLET | Freq: Four times a day (QID) | ORAL | Status: DC | PRN
  Administered 2022-11-07 (×3): 650 mg via ORAL
  Filled 2022-11-06 (×3): qty 2

## 2022-11-06 MED ORDER — AMLODIPINE BESYLATE 10 MG PO TABS
10.0000 mg | ORAL_TABLET | Freq: Every evening | ORAL | Status: DC
  Administered 2022-11-06 – 2022-11-07 (×2): 10 mg via ORAL
  Filled 2022-11-06 (×2): qty 1

## 2022-11-06 MED ORDER — ORAL CARE MOUTH RINSE: 15.0000 mL | Freq: Once | OROMUCOSAL | Status: AC

## 2022-11-06 MED ORDER — KETOROLAC TROMETHAMINE 15 MG/ML IJ SOLN
7.5000 mg | Freq: Four times a day (QID) | INTRAMUSCULAR | Status: AC
  Administered 2022-11-06 – 2022-11-07 (×3): 7.5 mg via INTRAVENOUS
  Filled 2022-11-06 (×3): qty 1

## 2022-11-06 MED ORDER — OXYCODONE HCL 5 MG PO TABS
10.0000 mg | ORAL_TABLET | ORAL | Status: DC | PRN
  Administered 2022-11-06: 5 mg via ORAL

## 2022-11-06 MED ORDER — ONDANSETRON HCL 4 MG/2ML IJ SOLN
INTRAMUSCULAR | Status: AC
  Filled 2022-11-06: qty 2

## 2022-11-06 MED ORDER — LORAZEPAM 0.5 MG PO TABS: 0.5000 mg | ORAL_TABLET | Freq: Two times a day (BID) | ORAL | Status: DC | PRN

## 2022-11-06 MED ORDER — PHENOL 1.4 % MT LIQD: 1.0000 | OROMUCOSAL | Status: DC | PRN

## 2022-11-06 MED ORDER — ONDANSETRON HCL 4 MG PO TABS
4.0000 mg | ORAL_TABLET | Freq: Four times a day (QID) | ORAL | Status: DC | PRN
  Administered 2022-11-08: 4 mg via ORAL
  Filled 2022-11-06: qty 1

## 2022-11-06 MED ORDER — FENTANYL CITRATE (PF) 100 MCG/2ML IJ SOLN
INTRAMUSCULAR | Status: DC | PRN
  Administered 2022-11-06: 50 ug via INTRAVENOUS

## 2022-11-06 MED ORDER — MIDAZOLAM HCL 2 MG/2ML IJ SOLN: 0.5000 mg | Freq: Once | INTRAMUSCULAR | Status: DC | PRN

## 2022-11-06 MED ORDER — AMISULPRIDE (ANTIEMETIC) 5 MG/2ML IV SOLN
INTRAVENOUS | Status: DC | PRN
  Administered 2022-11-06: 10 mg via INTRAVENOUS

## 2022-11-06 MED ORDER — ACETAMINOPHEN 500 MG PO TABS
1000.0000 mg | ORAL_TABLET | Freq: Once | ORAL | Status: AC
  Administered 2022-11-06: 1000 mg via ORAL
  Filled 2022-11-06: qty 2

## 2022-11-06 MED ORDER — ALUM & MAG HYDROXIDE-SIMETH 200-200-20 MG/5ML PO SUSP: 30.0000 mL | ORAL | Status: DC | PRN

## 2022-11-06 MED ORDER — METHOCARBAMOL 500 MG PO TABS
500.0000 mg | ORAL_TABLET | Freq: Four times a day (QID) | ORAL | Status: DC | PRN
  Administered 2022-11-08: 500 mg via ORAL
  Filled 2022-11-06 (×2): qty 1

## 2022-11-06 MED ORDER — TRANEXAMIC ACID-NACL 1000-0.7 MG/100ML-% IV SOLN
1000.0000 mg | INTRAVENOUS | Status: AC
  Administered 2022-11-06: 1000 mg via INTRAVENOUS
  Filled 2022-11-06: qty 100

## 2022-11-06 MED ORDER — POVIDONE-IODINE 10 % EX SWAB
2.0000 | Freq: Once | CUTANEOUS | Status: AC
  Administered 2022-11-06: 2 via TOPICAL

## 2022-11-06 MED ORDER — MIDAZOLAM HCL 2 MG/2ML IJ SOLN
INTRAMUSCULAR | Status: AC
  Filled 2022-11-06: qty 2

## 2022-11-06 MED ORDER — MIDAZOLAM HCL 5 MG/5ML IJ SOLN
INTRAMUSCULAR | Status: DC | PRN
  Administered 2022-11-06: 2 mg via INTRAVENOUS

## 2022-11-06 MED ORDER — HYDROMORPHONE HCL 1 MG/ML IJ SOLN
0.5000 mg | INTRAMUSCULAR | Status: DC | PRN
  Administered 2022-11-06 (×2): 1 mg via INTRAVENOUS
  Filled 2022-11-06 (×2): qty 1

## 2022-11-06 MED ORDER — SODIUM CHLORIDE 0.9 % IV SOLN: 12.5000 mg | Freq: Four times a day (QID) | INTRAVENOUS | Status: DC | PRN

## 2022-11-06 MED ORDER — METHOCARBAMOL 500 MG IVPB - SIMPLE MED: 500.0000 mg | Freq: Four times a day (QID) | INTRAVENOUS | Status: DC | PRN

## 2022-11-06 MED ORDER — CHLORHEXIDINE GLUCONATE 0.12 % MT SOLN
15.0000 mL | Freq: Once | OROMUCOSAL | Status: AC
  Administered 2022-11-06: 15 mL via OROMUCOSAL

## 2022-11-06 MED ORDER — OXYCODONE HCL 5 MG/5ML PO SOLN: 5.0000 mg | Freq: Once | ORAL | Status: DC | PRN

## 2022-11-06 MED ORDER — LACTATED RINGERS IV SOLN: INTRAVENOUS | Status: DC

## 2022-11-06 MED ORDER — MEPERIDINE HCL 50 MG/ML IJ SOLN: 6.2500 mg | INTRAMUSCULAR | Status: DC | PRN

## 2022-11-06 MED ORDER — DIPHENHYDRAMINE HCL 12.5 MG/5ML PO ELIX: 12.5000 mg | ORAL_SOLUTION | ORAL | Status: DC | PRN

## 2022-11-06 MED ORDER — SODIUM CHLORIDE 0.9 % IV SOLN
INTRAVENOUS | Status: DC
  Administered 2022-11-06: 1000 mL via INTRAVENOUS

## 2022-11-06 MED ORDER — TRAMADOL HCL 50 MG PO TABS
100.0000 mg | ORAL_TABLET | Freq: Four times a day (QID) | ORAL | Status: DC | PRN
  Administered 2022-11-07 – 2022-11-08 (×2): 100 mg via ORAL
  Filled 2022-11-06 (×3): qty 2

## 2022-11-06 MED ORDER — MENTHOL 3 MG MT LOZG: 1.0000 | LOZENGE | OROMUCOSAL | Status: DC | PRN

## 2022-11-06 MED ORDER — PROMETHAZINE HCL 25 MG/ML IJ SOLN: 6.2500 mg | INTRAMUSCULAR | Status: DC | PRN

## 2022-11-06 MED ORDER — ASPIRIN 81 MG PO CHEW
81.0000 mg | CHEWABLE_TABLET | Freq: Two times a day (BID) | ORAL | Status: DC
  Administered 2022-11-06 – 2022-11-08 (×4): 81 mg via ORAL
  Filled 2022-11-06 (×4): qty 1

## 2022-11-06 MED ORDER — HYDROMORPHONE HCL 1 MG/ML IJ SOLN
INTRAMUSCULAR | Status: AC
  Filled 2022-11-06: qty 2

## 2022-11-06 MED ORDER — DOCUSATE SODIUM 100 MG PO CAPS
100.0000 mg | ORAL_CAPSULE | Freq: Two times a day (BID) | ORAL | Status: DC
  Administered 2022-11-06 – 2022-11-08 (×4): 100 mg via ORAL
  Filled 2022-11-06 (×4): qty 1

## 2022-11-06 SURGICAL SUPPLY — 42 items
APL SKNCLS STERI-STRIP NONHPOA (GAUZE/BANDAGES/DRESSINGS)
BAG COUNTER SPONGE SURGICOUNT (BAG) ×1 IMPLANT
BAG SPEC THK2 15X12 ZIP CLS (MISCELLANEOUS)
BAG SPNG CNTER NS LX DISP (BAG) ×1
BAG ZIPLOCK 12X15 (MISCELLANEOUS) IMPLANT
BENZOIN TINCTURE PRP APPL 2/3 (GAUZE/BANDAGES/DRESSINGS) IMPLANT
BLADE SAW SGTL 18X1.27X75 (BLADE) ×1 IMPLANT
COVER PERINEAL POST (MISCELLANEOUS) ×1 IMPLANT
COVER SURGICAL LIGHT HANDLE (MISCELLANEOUS) ×1 IMPLANT
DRAPE FOOT SWITCH (DRAPES) ×1 IMPLANT
DRAPE STERI IOBAN 125X83 (DRAPES) ×1 IMPLANT
DRAPE U-SHAPE 47X51 STRL (DRAPES) ×2 IMPLANT
DRSG AQUACEL AG ADV 3.5X10 (GAUZE/BANDAGES/DRESSINGS) ×1 IMPLANT
DURAPREP 26ML APPLICATOR (WOUND CARE) ×1 IMPLANT
ELECT REM PT RETURN 15FT ADLT (MISCELLANEOUS) ×1 IMPLANT
GAUZE XEROFORM 1X8 LF (GAUZE/BANDAGES/DRESSINGS) IMPLANT
GLOVE BIO SURGEON STRL SZ7.5 (GLOVE) ×1 IMPLANT
GLOVE BIOGEL PI IND STRL 8 (GLOVE) ×2 IMPLANT
GLOVE ECLIPSE 8.0 STRL XLNG CF (GLOVE) ×1 IMPLANT
GOWN STRL REUS W/ TWL XL LVL3 (GOWN DISPOSABLE) ×2 IMPLANT
GOWN STRL REUS W/TWL XL LVL3 (GOWN DISPOSABLE) ×2
HANDPIECE INTERPULSE COAX TIP (DISPOSABLE) ×1
HEAD M SROM 36MM 2 (Hips) IMPLANT
HOLDER FOLEY CATH W/STRAP (MISCELLANEOUS) ×1 IMPLANT
KIT TURNOVER KIT A (KITS) IMPLANT
LINER NEUTRAL 52X36MM PLUS 4 (Liner) IMPLANT
PACK ANTERIOR HIP CUSTOM (KITS) ×1 IMPLANT
PIN SECTOR W/GRIP ACE CUP 52MM (Hips) IMPLANT
SET HNDPC FAN SPRY TIP SCT (DISPOSABLE) ×1 IMPLANT
SROM M HEAD 36MM 2 (Hips) ×1 IMPLANT
STAPLER VISISTAT 35W (STAPLE) IMPLANT
STEM CORAIL KLA11 (Stem) IMPLANT
STRIP CLOSURE SKIN 1/2X4 (GAUZE/BANDAGES/DRESSINGS) IMPLANT
SUT ETHIBOND NAB CT1 #1 30IN (SUTURE) ×1 IMPLANT
SUT ETHILON 2 0 PS N (SUTURE) IMPLANT
SUT MNCRL AB 4-0 PS2 18 (SUTURE) IMPLANT
SUT VIC AB 0 CT1 36 (SUTURE) ×1 IMPLANT
SUT VIC AB 1 CT1 36 (SUTURE) ×1 IMPLANT
SUT VIC AB 2-0 CT1 27 (SUTURE) ×2
SUT VIC AB 2-0 CT1 TAPERPNT 27 (SUTURE) ×2 IMPLANT
TRAY FOLEY MTR SLVR 16FR STAT (SET/KITS/TRAYS/PACK) IMPLANT
YANKAUER SUCT BULB TIP NO VENT (SUCTIONS) ×1 IMPLANT

## 2022-11-06 NOTE — Interval H&P Note (Signed)
History and Physical Interval Note: The patient understands that he is here today for a left total hip replacement to treat his severe left hip arthritis.  There has been no acute or interval change in his medical status.  The risks and benefits of surgery have been discussed in detail and informed consent has been obtained.  The left operative hip has been marked.  11/06/2022 7:03 AM  Eric Johnston  has presented today for surgery, with the diagnosis of osteoarthritis left hip.  The various methods of treatment have been discussed with the patient and family. After consideration of risks, benefits and other options for treatment, the patient has consented to  Procedure(s): LEFT TOTAL HIP ARTHROPLASTY ANTERIOR APPROACH (Left) as a surgical intervention.  The patient's history has been reviewed, patient examined, no change in status, stable for surgery.  I have reviewed the patient's chart and labs.  Questions were answered to the patient's satisfaction.     [name]

## 2022-11-06 NOTE — Op Note (Signed)
Operative Note  Date of operation: 11/06/2022 Preoperative diagnosis: Left hip primary osteoarthritis Postoperative diagnosis: Same  Procedure: Left direct anterior total hip arthroplasty  Implants: Implant Name Type Inv. Item Serial No. Manufacturer Lot No. LRB No. Used Action  PIN SECTOR W/GRIP ACE CUP - [id number] Hips PIN SECTOR W/GRIP ACE CUP  [employer]PU[employer] [id number] Left 1 Implanted  LINER NEUTRAL 52X36MM PLUS 4 - [id number] Liner LINER NEUTRAL [id number] PLUS 4  DEPUY ORTH[employer] [id number] Left 1 Implanted  STEM CORAIL KLA11 - [id number] Stem STEM CORAIL KLA11  DEPU[employer] [id number] Left 1 Implanted  SROM M HEAD 2 - [id number] Hips SROM M HEAD 2  DEPUY ORTH[employer] [id number] Left 1 Implanted   Surgeon: Vanita Panda. Magnus Ivan, MD Assistant: Rexene Edison, PA-C  Anesthesia: Spinal Antibiotics: 2 g IV Ancef EBL: 300 cc Complications: None  Indications: The patient is a 52 year old gentleman well-known to me.  We actually replaced his right hip several years ago.  He has significant arthritis in his left hip.  We have seen this for many years now but it is at the point where it is causing daily pain in his left arthritic hip is definitely affecting his mobility, his quality of life and his activities day living.  He does wish to proceed with a total hip arthroplasty on the left side and we agree with this as well.  Having had this before he is fully aware of the risk of acute blood loss anemia, nerve vessel injury, fracture, infection, DVT, dislocation, implant failure, leg length differences and wound healing issues.  He understands her goals are hopefully decrease pain, improve mobility, and improve quality of life.  Procedure description: After informed consent was obtained and the appropriate left hip was marked, the patient was brought to the operating room and sat up on the stretcher where spinal anesthesia was obtained.  He was then laid in a supine position  on the stretcher and a Foley catheter was placed.  Traction boots were placed on both his feet and he was then placed supine on the Hana fracture table with both legs and inline skeletal traction devices but no traction applied.  Perineal post was placed as well.  His left hip and pelvis were assessed radiographically.  His left hip was then prepped and draped with DuraPrep and sterile drapes.  A timeout was called and he was identified as the correct patient and the correct left hip.  An incision was then made just inferior and posterior to the ASIS and carried slightly obliquely down the leg.  Dissection was carried down to the tensor fascia lata muscle and the tensor fascia was divided longitudinally to proceed with a direct anterior approach to the hip.  Circumflex vessels were identified and cauterized.  The hip capsule identified and opened up in L-type format finding a moderate joint effusion.  Cobra retractors were placed around the medial and lateral femoral neck and a femoral neck cut was made with an oscillating saw just proximal to the lesser trochanter.  This Was completed with an osteotome.  A corkscrew guide was placed in the femoral head and the femoral head was removed in its entirety and it was widely devoid of cartilage.  A bent Hohmann was placed over the medial acetabular rim and remnants of the acetabular labrum debris removed.  Reaming was then initiated using the reaming system from a size 43 reamer and stepwise increments going to a size 51 reamer with all reamers placed under  direct visualization and the last reamer placed under direct fluoroscopy in order to obtain the depth of reaming, the inclination and anteversion.  The real DePuy sector GRIPTION acetabular pendant size 52 was then placed without difficulty followed by a 36+4 polythene liner.  Attention was then turned to the femur.  With the left leg externally rotated to 130 degrees, extended and adducted, a Mueller retractor was  placed medially and a Hohmann retractor was placed behind the greater trochanter.  The lateral joint capsule was released and a box cutting osteotome was used to enter the femoral canal.  Broaching was then initiated from a size 8 broach going up to a size 11 broach.  We tried to lateralize is much as we could.  We then trialed a varus offset femoral neck and a 36-2 hip ball.  This was reduced and acetabulum we were pleased with leg length, offset, range of motion and stability assessed radiographically and mechanically.  We then dislocated the hip and removed the trial components.  We then placed the real Corail size 11 femoral component with varus offset followed by the real 36-2 metal head ball.  Again this was reduced in the acetabulum we assessed again radiographically and mechanically and were pleased.  We then irrigated the soft tissue with normal saline solution.  The joint capsule was closed interrupted #1 Ethibond suture followed by #1 Vicryl to close the tensor fascia.  0 Vicryl was used to close the deep tissue and 2-0 Vicryl was used to close subcutaneous tissue.  The skin was closed with staples.  Aquacel dressing was applied.  The patient was taken off of the Hana table and taken to the recovery room in stable condition.  Rexene Edison, PA-C did assist during the entire case and beginning the end and his assistance was medically necessary and crucial for soft tissue management and retraction, helping guide implant placement and a layered closure of the wound.

## 2022-11-06 NOTE — Evaluation (Signed)
Physical Therapy Evaluation Patient Details Name: Eric Johnston MRN: [id number] DOB: 06/18/1970 Today's Date: 11/06/2022  History of Present Illness  Pt s/p L THR and with hx of R THR in 2017  Clinical Impression  Pt s/p L THR and presents with decreased L LE strength/ROM, post op pain, and N&V limiting functional mobility.  Pt should progress to dc home with family assist.      If plan is discharge home, recommend the following: A little help with walking and/or transfers;A little help with bathing/dressing/bathroom;Assistance with cooking/housework;Assist for transportation;Help with stairs or ramp for entrance   Can travel by private vehicle        Equipment Recommendations None recommended by PT  Recommendations for Other Services       Functional Status Assessment Patient has had a recent decline in their functional status and demonstrates the ability to make significant improvements in function in a reasonable and predictable amount of time.     Precautions / Restrictions Precautions Precautions: Fall Restrictions Weight Bearing Restrictions: No Other Position/Activity Restrictions: WBAT      Mobility  Bed Mobility Overal bed mobility: Needs Assistance Bed Mobility: Supine to Sit     Supine to sit: Min assist     General bed mobility comments: Increased time with cues for sequence    Transfers Overall transfer level: Needs assistance Equipment used: Rolling walker (2 wheels) Transfers: Sit to/from Stand Sit to Stand: Min assist           General transfer comment: Steady assist with cues for LE management and use of UEs to self assist    Ambulation/Gait Ambulation/Gait assistance: Min assist, Min guard Gait Distance (Feet): 42 Feet Assistive device: Rolling walker (2 wheels) Gait Pattern/deviations: Step-to pattern, Step-through pattern, Decreased step length - right, Decreased step length - left, Shuffle, Trunk flexed Gait velocity: decr      General Gait Details: cues for posture, position from RW and initial sequence  Stairs            Wheelchair Mobility     Tilt Bed    Modified Rankin (Stroke Patients Only)       Balance Overall balance assessment: Mild deficits observed, not formally tested                                           Pertinent Vitals/Pain Pain Assessment Pain Assessment: 0-10 Pain Score: 8  Pain Location: L hip Pain Descriptors / Indicators: Aching, Sore Pain Intervention(s): Limited activity within patient's tolerance, Monitored during session, Premedicated before session, Patient requesting pain meds-RN notified, Ice applied    Home Living Family/patient expects to be discharged to:: Private residence Living Arrangements: Spouse/significant other   Type of Home: House Home Access: Stairs to enter   Secretary/administrator of Steps: 1 Alternate Level Stairs-Number of Steps: 20 Home Layout: Two level Home Equipment: Agricultural consultant (2 wheels);Cane - single point;Crutches      Prior Function Prior Level of Function : Independent/Modified Independent                     Hand Dominance   Dominant Hand: Right    Extremity/Trunk Assessment        Lower Extremity Assessment Lower Extremity Assessment: LLE deficits/detail    Cervical / Trunk Assessment Cervical / Trunk Assessment: Normal  Communication   Communication: No difficulties  Cognition Arousal/Alertness:  Awake/alert Behavior During Therapy: WFL for tasks assessed/performed Overall Cognitive Status: Within Functional Limits for tasks assessed                                          General Comments      Exercises Total Joint Exercises Ankle Circles/Pumps: AROM, Both, 15 reps, Supine   Assessment/Plan    PT Assessment Patient needs continued PT services  PT Problem List Decreased strength;Decreased range of motion;Decreased activity tolerance;Decreased  mobility;Decreased balance;Decreased knowledge of use of DME;Pain       PT Treatment Interventions DME instruction;Gait training;Stair training;Functional mobility training;Therapeutic activities;Therapeutic exercise;Patient/family education    PT Goals (Current goals can be found in the Care Plan section)  Acute Rehab PT Goals Patient Stated Goal: REgain IND PT Goal Formulation: With patient Time For Goal Achievement: 11/13/22 Potential to Achieve Goals: Good    Frequency 7X/week     Co-evaluation               AM-PAC PT "6 Clicks" Mobility  Outcome Measure Help needed turning from your back to your side while in a flat bed without using bedrails?: A Little Help needed moving from lying on your back to sitting on the side of a flat bed without using bedrails?: A Little Help needed moving to and from a bed to a chair (including a wheelchair)?: A Little Help needed standing up from a chair using your arms (e.g., wheelchair or bedside chair)?: A Little Help needed to walk in hospital room?: A Little Help needed climbing 3-5 steps with a railing? : A Lot 6 Click Score: 17    End of Session Equipment Utilized During Treatment: Gait belt Activity Tolerance: Patient tolerated treatment well Patient left: in chair;with call bell/phone within reach;with chair alarm set;with family/visitor present Nurse Communication: Mobility status PT Visit Diagnosis: Difficulty in walking, not elsewhere classified (R26.2);Pain Pain - Right/Left: Left Pain - part of body: Hip    Time: 1610-9604 PT Time Calculation (min) (ACUTE ONLY): 26 min   Charges:   PT Evaluation $PT Eval Low Complexity: 1 Low PT Treatments $Gait Training: 8-22 mins PT General Charges $$ ACUTE PT VISIT: 1 Visit         Mauro Kaufmann PT Acute Rehabilitation Services Pager [phone] Office [phone]   [facility] 11/06/2022, 4:48 PM

## 2022-11-06 NOTE — Anesthesia Postprocedure Evaluation (Signed)
Anesthesia Post Note  Patient: Eric Johnston  Procedure(s) Performed: LEFT TOTAL HIP ARTHROPLASTY ANTERIOR APPROACH (Left: Hip)     Patient location during evaluation: PACU Anesthesia Type: Spinal Level of consciousness: awake and alert, patient cooperative and oriented Pain management: pain level controlled Vital Signs Assessment: post-procedure vital signs reviewed and stable Respiratory status: spontaneous breathing, nonlabored ventilation and respiratory function stable Cardiovascular status: blood pressure returned to baseline and stable Postop Assessment: spinal receding, patient able to bend at knees and no apparent nausea or vomiting Anesthetic complications: no   No notable events documented.  Last Vitals:  Vitals:   11/06/22 1145 11/06/22 1200  BP: 131/76 (!) 153/88  Pulse: 67 69  Resp: (!) 24 15  Temp:    SpO2: 98% 99%    Last Pain:  Vitals:   11/06/22 1145  TempSrc:   PainSc: 4                  Harry Shuck,E. Christien Frankl

## 2022-11-06 NOTE — Transfer of Care (Signed)
Immediate Anesthesia Transfer of Care Note  Patient: Eric Johnston  Procedure(s) Performed: LEFT TOTAL HIP ARTHROPLASTY ANTERIOR APPROACH (Left: Hip)  Patient Location: PACU  Anesthesia Type:MAC and Spinal  Level of Consciousness: sedated, patient cooperative, and responds to stimulation  Airway & Oxygen Therapy: Patient connected to face mask oxygen  Post-op Assessment: Report given to RN and Post -op Vital signs reviewed and stable  Post vital signs: Reviewed and stable   Last Vitals:  Vitals Value Taken Time  BP 126/71 11/06/22 1104  Temp    Pulse 76 11/06/22 1114  Resp 18 11/06/22 1114  SpO2 93 % 11/06/22 1114  Vitals shown include unfiled device data.  Last Pain:  Vitals:   11/06/22 0659  TempSrc:   PainSc: 0-No pain         Complications: No notable events documented.

## 2022-11-06 NOTE — Anesthesia Procedure Notes (Signed)
Spinal  Start time: 11/06/2022 9:18 AM End time: 11/06/2022 9:22 AM Reason for block: surgical anesthesia Staffing Performed: resident/CRNA  Resident/CRNA: Elisabeth Cara, CRNA Performed by: Elisabeth Cara, CRNA Authorized by: Jairo Ben, MD   Preanesthetic Checklist Completed: patient identified, IV checked, site marked, risks and benefits discussed, surgical consent, monitors and equipment checked, pre-op evaluation and timeout performed Spinal Block Patient position: sitting Prep: DuraPrep and site prepped and draped Patient monitoring: heart rate, cardiac monitor, continuous pulse ox and blood pressure Location: L3-4 Injection technique: single-shot Needle Needle type: Pencan  Needle gauge: 24 G Needle length: 10 cm Assessment Sensory level: T4 Events: CSF return Additional Notes Pt placed in sitting position for spinal placement. Spinal kit checked and verified. Sterile prep and drape of back. Lidocaine injected to site. One attempt. + clear free flowing CSF obtained. - heme. Local injected with ease. Pt tolerated procedure well and placed supine after spinal placement.

## 2022-11-07 DIAGNOSIS — M1612 Unilateral primary osteoarthritis, left hip: Secondary | ICD-10-CM | POA: Diagnosis not present

## 2022-11-07 MED ORDER — METHOCARBAMOL 500 MG PO TABS: 500.0000 mg | ORAL_TABLET | Freq: Four times a day (QID) | ORAL | 1 refills | Status: AC | PRN

## 2022-11-07 MED ORDER — TRAMADOL HCL 50 MG PO TABS: 100.0000 mg | ORAL_TABLET | Freq: Four times a day (QID) | ORAL | 0 refills | Status: DC | PRN

## 2022-11-07 MED ORDER — ASPIRIN 81 MG PO CHEW: 81.0000 mg | CHEWABLE_TABLET | Freq: Two times a day (BID) | ORAL | 0 refills | Status: AC

## 2022-11-07 NOTE — Progress Notes (Signed)
Physical Therapy Treatment Patient Details Name: Eric Johnston MRN: [id number] DOB: [birth year] Today's Date: 11/07/2022   History of Present Illness Pt s/p L THR and with hx of R THR in 2017    PT Comments  Pt in good spirits and progressing well with mobility.  Pt hopeful for dc home tomorrow.    If plan is discharge home, recommend the following: A little help with walking and/or transfers;A little help with bathing/dressing/bathroom;Assistance with cooking/housework;Assist for transportation;Help with stairs or ramp for entrance   Can travel by private vehicle        Equipment Recommendations  None recommended by PT    Recommendations for Other Services       Precautions / Restrictions Precautions Precautions: Fall Restrictions Weight Bearing Restrictions: No Other Position/Activity Restrictions: WBAT     Mobility  Bed Mobility Overal bed mobility: Needs Assistance Bed Mobility: Supine to Sit     Supine to sit: Min guard     General bed mobility comments: Increased time with cues for sequence    Transfers Overall transfer level: Needs assistance Equipment used: Rolling walker (2 wheels) Transfers: Sit to/from Stand Sit to Stand: Min guard           General transfer comment: Steady assist with cues for LE management and use of UEs to self assist    Ambulation/Gait Ambulation/Gait assistance: Min guard Gait Distance (Feet): 125 Feet Assistive device: Rolling walker (2 wheels) Gait Pattern/deviations: Step-to pattern, Step-through pattern, Decreased step length - right, Decreased step length - left, Shuffle, Trunk flexed Gait velocity: decr     General Gait Details: cues for posture, position from RW and initial sequence   Stairs             Wheelchair Mobility     Tilt Bed    Modified Rankin (Stroke Patients Only)       Balance Overall balance assessment: Mild deficits observed, not formally tested                                           Cognition Arousal/Alertness: Awake/alert Behavior During Therapy: WFL for tasks assessed/performed Overall Cognitive Status: Within Functional Limits for tasks assessed                                          Exercises Total Joint Exercises Ankle Circles/Pumps: AROM, Both, 15 reps, Supine Quad Sets: AROM, Both, 10 reps, Supine Heel Slides: AAROM, Left, Supine, 20 reps Hip ABduction/ADduction: AAROM, Left, 15 reps, Supine    General Comments        Pertinent Vitals/Pain Pain Assessment Pain Assessment: 0-10 Pain Score: 5  Pain Location: L hip Pain Descriptors / Indicators: Aching, Sore Pain Intervention(s): Limited activity within patient's tolerance, Monitored during session, Premedicated before session, Ice applied    Home Living                          Prior Function            PT Goals (current goals can now be found in the care plan section) Acute Rehab PT Goals Patient Stated Goal: REgain IND PT Goal Formulation: With patient Time For Goal Achievement: 11/13/22 Potential to Achieve Goals: Good Progress towards PT goals: Progressing  toward goals    Frequency    7X/week      PT Plan Current plan remains appropriate    Co-evaluation              AM-PAC PT "6 Clicks" Mobility   Outcome Measure  Help needed turning from your back to your side while in a flat bed without using bedrails?: A Little Help needed moving from lying on your back to sitting on the side of a flat bed without using bedrails?: A Little Help needed moving to and from a bed to a chair (including a wheelchair)?: A Little Help needed standing up from a chair using your arms (e.g., wheelchair or bedside chair)?: A Little Help needed to walk in hospital room?: A Little Help needed climbing 3-5 steps with a railing? : A Lot 6 Click Score: 17    End of Session Equipment Utilized During Treatment: Gait belt Activity  Tolerance: Patient tolerated treatment well Patient left: in chair;with call bell/phone within reach;with chair alarm set Nurse Communication: Mobility status PT Visit Diagnosis: Difficulty in walking, not elsewhere classified (R26.2);Pain Pain - Right/Left: Left Pain - part of body: Hip     Time: 7253-6644 PT Time Calculation (min) (ACUTE ONLY): 27 min  Charges:    $Gait Training: 8-22 mins $Therapeutic Exercise: 8-22 mins PT General Charges $$ ACUTE PT VISIT: 1 Visit                     Mauro Kaufmann PT Acute Rehabilitation Services Pager [phone] Office [phone]    [facility] 11/07/2022, 12:36 PM

## 2022-11-07 NOTE — Discharge Instructions (Signed)

## 2022-11-07 NOTE — Progress Notes (Signed)
Bladder Scan performed at 1915 d/t inability to void with >764 cc noted. Order given for In & Out Cath. Patient advised of need to perform in & out cath and requested to try and void standing up with total of 1000 cc output. Post-void residual Bladder Scan performed with 371 cc noted. Patient reports relief with voiding.

## 2022-11-07 NOTE — TOC Initial Note (Signed)
Transition of Care [facility]) - Initial/Assessment Note    Patient Details  Name: Eric Johnston MRN: [id number] Date of Birth: [birth year]  [facility] CM/SW Contact:    Georgie Chard, LCSW Phone Number: 11/07/2022, 1:08 PM  Clinical Narrative:                 CSW spoke with patient at this time there are no TOC needs patient is good for DC expected on 11/08/2022.     Barriers to Discharge: No Barriers Identified   Patient Goals and CMS Choice            Expected Discharge Plan and Services                                              Prior Living Arrangements/Services                       Activities of Daily Living Home Assistive Devices/Equipment: Eyeglasses ADL Screening (condition at time of admission) Patient's cognitive ability adequate to safely complete daily activities?: Yes Is the patient deaf or have difficulty hearing?: No Does the patient have difficulty seeing, even when wearing glasses/contacts?: No Does the patient have difficulty concentrating, remembering, or making decisions?: No Patient able to express need for assistance with ADLs?: Yes Does the patient have difficulty dressing or bathing?: No Independently performs ADLs?: Yes (appropriate for developmental age) Does the patient have difficulty walking or climbing stairs?: No Weakness of Legs: None Weakness of Arms/Hands: None  Permission Sought/Granted                  Emotional Assessment              Admission diagnosis:  Status post total replacement of left hip [Z96.642] Patient Active Problem List   Diagnosis Date Noted   Status post total replacement of left hip 11/06/2022   Unilateral primary osteoarthritis, left hip 09/16/2022   Biceps rupture, distal, left, subsequent encounter    Angio-edema 05/06/2020   Pruritus 05/06/2020   Adverse reaction to food, subsequent encounter 05/06/2020   Status post total replacement of right hip  06/14/2015   PCP:  Tally Joe, MD Pharmacy:   [facility] [phone] - [place], [place] - [address] AT [facility] [address] [facility] [address] [place] [address] Phone: [phone] Fax: [phone]     Social Determinants of Health (SDOH) Social History: SDOH Screenings   Food Insecurity: No Food Insecurity (11/06/2022)  Housing: Low Risk  (11/06/2022)  Transportation Needs: No Transportation Needs (11/06/2022)  Utilities: Not At Risk (11/06/2022)  Social Connections: Unknown (08/15/2021)   Received from [facility]  Tobacco Use: Low Risk  (11/06/2022)   SDOH Interventions:     Readmission Risk Interventions     No data to display

## 2022-11-07 NOTE — Progress Notes (Signed)
Physical Therapy Treatment Patient Details Name: Eric Johnston MRN: [id number] DOB: [birth year] Today's Date: 11/07/2022   History of Present Illness Pt s/p L THR and with hx of R THR in 2017    PT Comments  Pt continues motivated, progressing well with mobility and plans for dc home tomorrow.    If plan is discharge home, recommend the following: A little help with walking and/or transfers;A little help with bathing/dressing/bathroom;Assistance with cooking/housework;Assist for transportation;Help with stairs or ramp for entrance   Can travel by private vehicle        Equipment Recommendations  None recommended by PT    Recommendations for Other Services       Precautions / Restrictions Precautions Precautions: Fall Restrictions Weight Bearing Restrictions: No Other Position/Activity Restrictions: WBAT     Mobility  Bed Mobility Overal bed mobility: Needs Assistance Bed Mobility: Supine to Sit     Supine to sit: Min guard     General bed mobility comments: Pt up in chair and requests back to same    Transfers Overall transfer level: Needs assistance Equipment used: Rolling walker (2 wheels) Transfers: Sit to/from Stand Sit to Stand: Min guard           General transfer comment: Steady assist with cues for LE management and use of UEs to self assist    Ambulation/Gait Ambulation/Gait assistance: Min guard, Supervision Gait Distance (Feet): 200 Feet Assistive device: Rolling walker (2 wheels) Gait Pattern/deviations: Step-to pattern, Step-through pattern, Decreased step length - right, Decreased step length - left, Shuffle, Trunk flexed Gait velocity: decr     General Gait Details: min cues for posture, position from RW and initial sequence   Stairs Stairs: Yes Stairs assistance: Min guard Stair Management: One rail Left, Forwards, Step to pattern Number of Stairs: 20     Wheelchair Mobility     Tilt Bed    Modified Rankin (Stroke  Patients Only)       Balance Overall balance assessment: Mild deficits observed, not formally tested                                          Cognition Arousal/Alertness: Awake/alert Behavior During Therapy: WFL for tasks assessed/performed Overall Cognitive Status: Within Functional Limits for tasks assessed                                          Exercises Total Joint Exercises Ankle Circles/Pumps: AROM, Both, 15 reps, Supine Quad Sets: AROM, Both, 10 reps, Supine Heel Slides: AAROM, Left, Supine, 20 reps Hip ABduction/ADduction: AAROM, Left, 15 reps, Supine    General Comments        Pertinent Vitals/Pain Pain Assessment Pain Assessment: 0-10 Pain Score: 5  Pain Location: L hip Pain Descriptors / Indicators: Aching, Sore Pain Intervention(s): Limited activity within patient's tolerance, Monitored during session, Premedicated before session    Home Living                          Prior Function            PT Goals (current goals can now be found in the care plan section) Acute Rehab PT Goals Patient Stated Goal: REgain IND PT Goal Formulation: With patient Time For Goal Achievement:  11/13/22 Potential to Achieve Goals: Good Progress towards PT goals: Progressing toward goals    Frequency    7X/week      PT Plan Current plan remains appropriate    Co-evaluation              AM-PAC PT "6 Clicks" Mobility   Outcome Measure  Help needed turning from your back to your side while in a flat bed without using bedrails?: A Little Help needed moving from lying on your back to sitting on the side of a flat bed without using bedrails?: A Little Help needed moving to and from a bed to a chair (including a wheelchair)?: A Little Help needed standing up from a chair using your arms (e.g., wheelchair or bedside chair)?: A Little Help needed to walk in hospital room?: A Little Help needed climbing 3-5 steps with  a railing? : A Little 6 Click Score: 18    End of Session Equipment Utilized During Treatment: Gait belt Activity Tolerance: Patient tolerated treatment well Patient left: in chair;with call bell/phone within reach;with chair alarm set Nurse Communication: Mobility status PT Visit Diagnosis: Difficulty in walking, not elsewhere classified (R26.2);Pain Pain - Right/Left: Left Pain - part of body: Hip     Time: 1534-1550 PT Time Calculation (min) (ACUTE ONLY): 16 min  Charges:    $Gait Training: 8-22 mins PT General Charges $$ ACUTE PT VISIT: 1 Visit                     Mauro Kaufmann PT Acute Rehabilitation Services Pager [phone] Office [phone]    [facility] 11/07/2022, 4:49 PM

## 2022-11-07 NOTE — Plan of Care (Signed)

## 2022-11-07 NOTE — Progress Notes (Signed)
Subjective: 1 Day Post-Op Procedure(s) (LRB): LEFT TOTAL HIP ARTHROPLASTY ANTERIOR APPROACH (Left) Patient reports pain as mild.  Had delt with a lot of nausea yesterday.  Objective: Vital signs in last 24 hours: Temp:  [98 F (36.7 C)-99.6 F (37.6 C)] 98.8 F (37.1 C) (08/03 0941) Pulse Rate:  [67-84] 82 (08/03 0216) Resp:  [15-24] 18 (08/03 0941) BP: (125-164)/(71-89) 125/81 (08/03 0941) SpO2:  [93 %-100 %] 99 % (08/03 0941) Weight:  [81.6 kg] 81.6 kg (08/02 1347)  Intake/Output from previous day: 08/02 0701 - 08/03 0700 In: 3155.1 [P.O.:480; I.V.:2425.1; IV Piggyback:250] Out: 2650 [Urine:2350; Blood:300] Intake/Output this shift: Total I/O In: 240 [P.O.:240] Out: -   Recent Labs    11/07/22 0342  HGB 11.6*   Recent Labs    11/07/22 0342  WBC 10.4  RBC 4.52  HCT 37.0*  PLT 166   Recent Labs    11/07/22 0342  NA 135  K 3.9  CL 103  CO2 25  BUN 14  CREATININE 0.83  GLUCOSE 118*  CALCIUM 8.7*   No results for input(s): "LABPT", "INR" in the last 72 hours.  Sensation intact distally Intact pulses distally Dorsiflexion/Plantar flexion intact Incision: dressing C/D/I   Assessment/Plan: 1 Day Post-Op Procedure(s) (LRB): LEFT TOTAL HIP ARTHROPLASTY ANTERIOR APPROACH (Left) Up with therapy Plan for discharge tomorrow Discharge home with home health      [name] 11/07/2022, 10:49 AM

## 2022-11-08 DIAGNOSIS — M1612 Unilateral primary osteoarthritis, left hip: Secondary | ICD-10-CM | POA: Diagnosis not present

## 2022-11-08 NOTE — Progress Notes (Signed)
Physical Therapy Treatment Patient Details Name: Eric Johnston MRN: [id number] DOB: April 15, 1970 Today's Date: 11/08/2022   History of Present Illness Pt s/p L THR and with hx of R THR in 2017    PT Comments  Pt continues motivated and progressing well with mobility.  Pt up to ambulate in halls, negotiated stairs and performed therex program with written HEP provided.  Pt eager for dc home this date.    If plan is discharge home, recommend the following: A little help with walking and/or transfers;A little help with bathing/dressing/bathroom;Assistance with cooking/housework;Assist for transportation;Help with stairs or ramp for entrance   Can travel by private vehicle        Equipment Recommendations  None recommended by PT    Recommendations for Other Services       Precautions / Restrictions Precautions Precautions: Fall Restrictions Weight Bearing Restrictions: No Other Position/Activity Restrictions: WBAT     Mobility  Bed Mobility Overal bed mobility: Modified Independent                  Transfers Overall transfer level: Modified independent                      Ambulation/Gait Ambulation/Gait assistance: Modified independent (Device/Increase time) Gait Distance (Feet): 400 Feet Assistive device: Rolling walker (2 wheels) Gait Pattern/deviations: Step-to pattern, Step-through pattern, Decreased step length - right, Decreased step length - left, Shuffle, Trunk flexed Gait velocity: decr     General Gait Details: min cues for posture, position from RW and initial sequence   Stairs Stairs: Yes Stairs assistance: Supervision Stair Management: One rail Left, Forwards, Step to pattern Number of Stairs: 8     Wheelchair Mobility     Tilt Bed    Modified Rankin (Stroke Patients Only)       Balance Overall balance assessment: Mild deficits observed, not formally tested                                           Cognition Arousal/Alertness: Awake/alert Behavior During Therapy: WFL for tasks assessed/performed Overall Cognitive Status: Within Functional Limits for tasks assessed                                          Exercises Total Joint Exercises Hip ABduction/ADduction: Left, 15 reps, AROM, Standing Long Arc Quad: AROM, Left, 15 reps, Supine Knee Flexion: AROM, Left, 10 reps, Standing Marching in Standing: AROM, Both, 20 reps, Standing Standing Hip Extension: AROM, Both, 15 reps, Standing    General Comments        Pertinent Vitals/Pain Pain Assessment Pain Assessment: 0-10 Pain Score: 6  Pain Location: L hip Pain Descriptors / Indicators: Aching, Sore Pain Intervention(s): Limited activity within patient's tolerance, Monitored during session    Home Living                          Prior Function            PT Goals (current goals can now be found in the care plan section) Acute Rehab PT Goals Patient Stated Goal: REgain IND PT Goal Formulation: With patient Time For Goal Achievement: 11/13/22 Potential to Achieve Goals: Good Progress towards PT goals: Progressing toward goals  Frequency    7X/week      PT Plan Current plan remains appropriate    Co-evaluation              AM-PAC PT "6 Clicks" Mobility   Outcome Measure  Help needed turning from your back to your side while in a flat bed without using bedrails?: None Help needed moving from lying on your back to sitting on the side of a flat bed without using bedrails?: None Help needed moving to and from a bed to a chair (including a wheelchair)?: None Help needed standing up from a chair using your arms (e.g., wheelchair or bedside chair)?: None Help needed to walk in hospital room?: None Help needed climbing 3-5 steps with a railing? : A Little 6 Click Score: 23    End of Session Equipment Utilized During Treatment: Gait belt Activity Tolerance: Patient tolerated  treatment well Patient left: in chair;with call bell/phone within reach;with chair alarm set Nurse Communication: Mobility status PT Visit Diagnosis: Difficulty in walking, not elsewhere classified (R26.2);Pain Pain - Right/Left: Left Pain - part of body: Hip     Time: 0821-0850 PT Time Calculation (min) (ACUTE ONLY): 29 min  Charges:    $Gait Training: 8-22 mins $Therapeutic Exercise: 8-22 mins PT General Charges $$ ACUTE PT VISIT: 1 Visit                     Eric Johnston PT Acute Rehabilitation Services Pager [phone] Office [phone]    Eric Johnston 11/08/2022, 8:54 AM

## 2022-11-08 NOTE — Progress Notes (Signed)
  Subjective: Eric Johnston is a 52 y.o. male s/p left THA.  They are POD 2.  [name] pain is controlled.  Pt has ambulated with some difficulty.  Denies any complaint of chest pain or shortness of breath or calf pain.  Is able to ambulate about 200 feet with physical therapy yesterday evening.  He is ready for discharge home.  Objective: Vital signs in last 24 hours: Temp:  [98.2 F (36.8 C)-99.2 F (37.3 C)] 99.2 F (37.3 C) (08/03 2100) Pulse Rate:  [95] 95 (08/03 2100) Resp:  [18-20] 20 (08/03 2100) BP: (125-153)/(74-84) 153/84 (08/03 2100) SpO2:  [98 %-99 %] 99 % (08/03 2100)  Intake/Output from previous day: 08/03 0701 - 08/04 0700 In: 840 [P.O.:840] Out: -  Intake/Output this shift: Total I/O In: 120 [P.O.:120] Out: -   Exam:  Minimal blood overlying the dressing Able to dorsiflex and plantarflex the left foot   Labs: Recent Labs    11/07/22 0342  HGB 11.6*   Recent Labs    11/07/22 0342  WBC 10.4  RBC 4.52  HCT 37.0*  PLT 166   Recent Labs    11/07/22 0342  NA 135  K 3.9  CL 103  CO2 25  BUN 14  CREATININE 0.83  GLUCOSE 118*  CALCIUM 8.7*   No results for input(s): "LABPT", "INR" in the last 72 hours.  Assessment/Plan: Pt is POD 2 s/p left THA.    -Plan to discharge to home today  -WBAT with a walker  -Follow-up with Dr. Magnus Ivan in 2 weeks in clinic     [facility] 11/08/2022, 8:29 AM

## 2022-11-09 ENCOUNTER — Encounter (HOSPITAL_COMMUNITY): Payer: Self-pay | Admitting: Orthopaedic Surgery

## 2022-11-11 NOTE — Discharge Summary (Signed)
Patient ID: Latrez Dorschner MRN: [id number] DOB/AGE: 1970-11-21 52 y.o.  Admit date: 11/06/2022 Discharge date: 11/08/2022  Admission Diagnoses:  Principal Problem:   Unilateral primary osteoarthritis, left hip Active Problems:   Status post total replacement of left hip   Discharge Diagnoses:  Same  Past Medical History:  Diagnosis Date   Angio-edema    Anxiety    situational   Avascular necrosis (HCC)    RT HIP   DVT (deep venous thrombosis) (HCC)    Headache    Hypertension    WAS TAKEN OFF BP MEDS BY PCP   PONV (postoperative nausea and vomiting)    Pre-diabetes     Surgeries: Procedure(s): LEFT TOTAL HIP ARTHROPLASTY ANTERIOR APPROACH on 11/06/2022   Consultants:   Discharged Condition: Improved  Hospital Course: Clayvon Alpern is an 52 y.o. male who was admitted 11/06/2022 for operative treatment ofUnilateral primary osteoarthritis, left hip. Patient has severe unremitting pain that affects sleep, daily activities, and work/hobbies. After pre-op clearance the patient was taken to the operating room on 11/06/2022 and underwent  Procedure(s): LEFT TOTAL HIP ARTHROPLASTY ANTERIOR APPROACH.    Patient was given perioperative antibiotics:  Anti-infectives (From admission, onward)    Start     Dose/Rate Route Frequency Ordered Stop   11/06/22 1500  ceFAZolin (ANCEF) IVPB 1 g/50 mL premix        1 g 100 mL/hr over 30 Minutes Intravenous Every 6 hours 11/06/22 1344 11/06/22 2101   11/06/22 0645  ceFAZolin (ANCEF) IVPB 2g/100 mL premix        2 g 200 mL/hr over 30 Minutes Intravenous On call to O.R. 11/06/22 [id number] 11/06/22 0953        Patient was given sequential compression devices, early ambulation, and chemoprophylaxis to prevent DVT.  Patient benefited maximally from hospital stay and there were no complications.    Recent vital signs: No data found.   Recent laboratory studies: No results for input(s): "WBC", "HGB", "HCT", "PLT", "NA", "K", "CL", "CO2", "BUN",  "CREATININE", "GLUCOSE", "INR", "CALCIUM" in the last 72 hours.  Invalid input(s): "PT", "2"   Discharge Medications:   Allergies as of 11/08/2022       Reactions   Shellfish Allergy Nausea And Vomiting        Medication List     STOP taking these medications    ibuprofen 200 MG tablet Commonly known as: ADVIL       TAKE these medications    acetaminophen 500 MG tablet Commonly known as: TYLENOL Take 1,000-1,500 mg by mouth every 6 (six) hours as needed (pain.).   AIRBORNE PO Take 1 tablet by mouth in the morning and at bedtime.   ADULT ONE DAILY GUMMIES PO Take 1 tablet by mouth in the morning. VitaFusion   amLODipine 10 MG tablet Commonly known as: NORVASC Take 10 mg by mouth every evening.   aspirin 81 MG chewable tablet Chew 1 tablet (81 mg total) by mouth 2 (two) times daily.   methocarbamol 500 MG tablet Commonly known as: ROBAXIN Take 1 tablet (500 mg total) by mouth every 6 (six) hours as needed for muscle spasms.   naproxen sodium 220 MG tablet Commonly known as: ALEVE Take 440-660 mg by mouth 2 (two) times daily as needed (pain.).   traMADol 50 MG tablet Commonly known as: ULTRAM Take 2 tablets (100 mg total) by mouth every 6 (six) hours as needed for moderate pain.        Diagnostic Studies: DG Pelvis Portable  Result Date: 11/06/2022 CLINICAL DATA:  Postop left hip arthroplasty. EXAM: PORTABLE PELVIS 1-2 VIEWS COMPARISON:  None Available. FINDINGS: Left hip arthroplasty in expected alignment. No periprosthetic lucency or fracture. Recent postsurgical change includes air and edema in the soft tissues. Lateral skin staples in place. Previous right hip arthroplasty. IMPRESSION: Left hip arthroplasty without immediate postoperative complication. Electronically Signed   By: Narda Rutherford M.D.   On: 11/06/2022 11:43   DG HIP UNILAT WITH PELVIS 2-3 VIEWS LEFT  Result Date: 11/06/2022 CLINICAL DATA:  Elective surgery. EXAM: DG HIP (WITH OR WITHOUT  PELVIS) 2-3V LEFT COMPARISON:  None Available. FINDINGS: Four fluoroscopic spot views of the pelvis and left hip obtained in the operating room. Images during hip arthroplasty. Fluoroscopy time 17 seconds. Dose 1.5066 mGy. IMPRESSION: Intraoperative fluoroscopy during left hip arthroplasty. Electronically Signed   By: Narda Rutherford M.D.   On: 11/06/2022 11:42   DG C-Arm 1-60 Min-No Report  Result Date: 11/06/2022 Fluoroscopy was utilized by the requesting physician.  No radiographic interpretation.   DG C-Arm 1-60 Min-No Report  Result Date: 11/06/2022 Fluoroscopy was utilized by the requesting physician.  No radiographic interpretation.    Disposition: Discharge disposition: 01-Home or Self Care       Discharge Instructions     Call MD / Call 911   Complete by: As directed    If you experience chest pain or shortness of breath, CALL 911 and be transported to the hospital emergency room.  If you develope a fever above 101 F, pus (white drainage) or increased drainage or redness at the wound, or calf pain, call your surgeon's office.   Constipation Prevention   Complete by: As directed    Drink plenty of fluids.  Prune juice may be helpful.  You may use a stool softener, such as Colace (over the counter) 100 mg twice a day.  Use MiraLax (over the counter) for constipation as needed.   Diet - low sodium heart healthy   Complete by: As directed    Increase activity slowly as tolerated   Complete by: As directed    Post-operative opioid taper instructions:   Complete by: As directed    POST-OPERATIVE OPIOID TAPER INSTRUCTIONS: It is important to wean off of your opioid medication as soon as possible. If you do not need pain medication after your surgery it is ok to stop day one. Opioids include: Codeine, Hydrocodone(Norco, Vicodin), Oxycodone(Percocet, oxycontin) and hydromorphone amongst others.  Long term and even short term use of opiods can cause: Increased pain  response Dependence Constipation Depression Respiratory depression And more.  Withdrawal symptoms can include Flu like symptoms Nausea, vomiting And more Techniques to manage these symptoms Hydrate well Eat regular healthy meals Stay active Use relaxation techniques(deep breathing, meditating, yoga) Do Not substitute Alcohol to help with tapering If you have been on opioids for less than two weeks and do not have pain than it is ok to stop all together.  Plan to wean off of opioids This plan should start within one week post op of your joint replacement. Maintain the same interval or time between taking each dose and first decrease the dose.  Cut the total daily intake of opioids by one tablet each day Next start to increase the time between doses. The last dose that should be eliminated is the evening dose.           Follow-up Information     [name], MD Follow up in 2 week(s).  Specialty: Orthopedic Surgery Contact information: [address] [place] [address] [phone]                  Signed: [name] 11/11/2022, 10:44 AM

## 2022-11-19 ENCOUNTER — Encounter: Payer: Self-pay | Admitting: Orthopaedic Surgery

## 2022-11-19 ENCOUNTER — Ambulatory Visit (INDEPENDENT_AMBULATORY_CARE_PROVIDER_SITE_OTHER): Payer: Commercial Managed Care - PPO | Admitting: Orthopaedic Surgery

## 2022-11-19 DIAGNOSIS — Z96642 Presence of left artificial hip joint: Secondary | ICD-10-CM

## 2022-11-19 NOTE — Progress Notes (Signed)
The patient is a young and athletic 52 year old gentleman who comes in for his first postoperative visit status post a left total hip replacement.  We have actually replaced his right hip before as well.  He is doing great.  On exam he does have a moderate seroma and I did withdraw 50 cc of fluid from his incision.  The incision otherwise looks great and staples been removed and Steri-Strips applied.  His leg lengths feel equal.  He does work in [employer] ge[employer].  We will let him return to work but only 20 to 25 hours a week starting September 9.  For the first 2 weeks he will work at that level and then can resume full work hours starting September 23.  Will see him back in a month to see how he is doing overall.  He can stop his baby aspirin as well.

## 2022-11-23 ENCOUNTER — Encounter: Payer: Self-pay | Admitting: Orthopaedic Surgery

## 2022-11-23 ENCOUNTER — Other Ambulatory Visit: Payer: Self-pay | Admitting: Orthopaedic Surgery

## 2022-11-23 MED ORDER — TRAMADOL HCL 50 MG PO TABS: 100.0000 mg | ORAL_TABLET | Freq: Four times a day (QID) | ORAL | 0 refills | Status: AC | PRN

## 2022-11-23 MED ORDER — CELECOXIB 200 MG PO CAPS: 200.0000 mg | ORAL_CAPSULE | Freq: Two times a day (BID) | ORAL | 1 refills | Status: DC | PRN

## 2022-12-21 ENCOUNTER — Encounter: Payer: Self-pay | Admitting: Orthopaedic Surgery

## 2022-12-21 ENCOUNTER — Ambulatory Visit: Payer: Commercial Managed Care - PPO | Admitting: Orthopaedic Surgery

## 2022-12-21 DIAGNOSIS — Z96641 Presence of right artificial hip joint: Secondary | ICD-10-CM

## 2022-12-21 DIAGNOSIS — Z96642 Presence of left artificial hip joint: Secondary | ICD-10-CM

## 2022-12-21 NOTE — Progress Notes (Signed)
The patient is here today at around 6 weeks status post a left total hip arthroplasty.  We replaced his right hip back in 2017.  He is doing well overall.  He is walking with a normal-appearing gait.  He actually did some squats and lunges in the office showing me how well he is doing.  He has a little bit of pain in the groin to be expected on that left side.  His leg lengths are equal.  On exam again everything is moving smoothly.  He is doing better with his ADLs in terms of putting his shoes and socks on.  At this point he has no restrictions other than the running on a regular basis.  I would like to see him back in 6 months for standing AP pelvis.
